# Patient Record
Sex: Male | Born: 1995 | Race: White | Hispanic: No | Marital: Single | Smoking: Never smoker
Health system: Southern US, Community
[De-identification: ages and names within clinical notes are randomized; demographics above are authoritative.]

## PROBLEM LIST (undated history)

## (undated) DIAGNOSIS — C801 Malignant (primary) neoplasm, unspecified: Secondary | ICD-10-CM

## (undated) DIAGNOSIS — F840 Autistic disorder: Secondary | ICD-10-CM

---
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
## 2018-01-03 ENCOUNTER — Other Ambulatory Visit: Payer: Self-pay | Admitting: Urology

## 2018-01-03 ENCOUNTER — Other Ambulatory Visit (HOSPITAL_COMMUNITY)
Admission: RE | Admit: 2018-01-03 | Discharge: 2018-01-03 | Disposition: A | Discharge status: Home / Self Care | Payer: BLUE CROSS/BLUE SHIELD | Source: Other Acute Inpatient Hospital | Attending: Urology | Admitting: Urology

## 2018-01-03 ENCOUNTER — Other Ambulatory Visit: Payer: Self-pay

## 2018-01-03 ENCOUNTER — Encounter (HOSPITAL_BASED_OUTPATIENT_CLINIC_OR_DEPARTMENT_OTHER): Payer: Self-pay

## 2018-01-03 DIAGNOSIS — C6290 Malignant neoplasm of unspecified testis, unspecified whether descended or undescended: Secondary | ICD-10-CM | POA: Diagnosis present

## 2018-01-03 LAB — PROTIME-INR
INR: 1.06
Prothrombin Time: 13.7 seconds (ref 11.4–15.2)

## 2018-01-03 LAB — APTT: aPTT: 32 seconds (ref 24–36)

## 2018-01-03 NOTE — Progress Notes (Signed)
Spoke with: Greg Peterson and his father Greg Peterson NPO:  After Midnight, no gum, candy, or mints   Arrival time:  9:00AM Labs:  01/03/2018 CBC, CMP, PT, PTT, Tumor Marker at Dr. Ralene Muskrat office AM medications:  None Pre op orders: Needs second sign Ride home: Greg Peterson (dad) [phone]

## 2018-01-03 NOTE — H&P (Signed)
CC: I have testicular cancer.  HPI: Greg Peterson is a 22 year-old male patient who was referred by Dr. Consuello Masse, MD who is here for testicular cancer.  The problem is on the right side. His cancer was diagnosed 12/31/2017. He has not had any treatment for his testicular cancer.   Greg is a 22 yo WM who is sent in consultation by Dr. Quintin Alto for a right testicular mass. Greg had the onset on 1/26 of right testicular pain. A scrotal US on 1/28 showed a 5.9cm right testicle with an infiltrating mass consistent with a right testicular neoplasm. He has had no testicular trauma. He had a cut on the mons recently. He has had no GU surgery or UTI's. He has had some abdominal pain and headaches. He has had some back pain as well over the holiday. He hasn't had any weight loss.      ALLERGIES: None   MEDICATIONS: None   GU PSH: None   NON-GU PSH: None   GU PMH: None   NON-GU PMH: None   FAMILY HISTORY: Congestive Heart Failure - Grandmother Prostate Cancer - Grandfather von Willebrand disease - Cousin   SOCIAL HISTORY: Marital Status: Single Preferred Language: English; Race: White Current Smoking Status: Patient has never smoked.   Tobacco Use Assessment Completed: Used Tobacco in last 30 days? Drinks 2 caffeinated drinks per day. Patient's occupation Education officer, environmental.    REVIEW OF SYSTEMS:    GU Review Male:   Patient denies frequent urination, hard to postpone urination, burning/ pain with urination, get up at night to urinate, leakage of urine, stream starts and stops, trouble starting your stream, have to strain to urinate , erection problems, and penile pain.  Gastrointestinal (Upper):   Patient denies nausea, vomiting, and indigestion/ heartburn.  Gastrointestinal (Lower):   Patient denies diarrhea and constipation.  Constitutional:   Patient denies fever, night sweats, weight loss, and fatigue.  Skin:   Patient reports skin rash/ lesion and itching.   Eyes:   Patient  denies blurred vision and double vision.  Ears/ Nose/ Throat:   Patient denies sore throat and sinus problems.  Hematologic/Lymphatic:   Patient denies swollen glands and easy bruising.  Cardiovascular:   Patient denies leg swelling and chest pains.  Respiratory:   Patient denies cough and shortness of breath.  Endocrine:   Patient denies excessive thirst.  Musculoskeletal:   Patient reports back pain. Patient denies joint pain.  Neurological:   Patient reports headaches. Patient denies dizziness.  Psychologic:   Patient denies depression and anxiety.   VITAL SIGNS:      01/03/2018 02:10 PM  Weight 185 lb / 83.91 kg  Height 71 in / 180.34 cm  BP 125/8 mmHg  Pulse 75 /min  Temperature 97.6 F / 36.4 C  BMI 25.8 kg/m   GU PHYSICAL EXAMINATION:    Scrotum: No lesions. No edema. No cysts. No warts.  Epididymides: Right: not distinct from the testicular mass. Left: No spermatocele, no masses, no cysts, no tenderness, no induration, no enlargement.   Testes: Solid mass right testis which is about 6cm, hard and non-tender. Normal location left testis. Normal location right testis. No mass, no cyst, no varicocele, no hydrocele left testis. No cyst, no varicocele, no hydrocele right testis.   Urethral Meatus: Normal size. No lesion, no wart, no discharge, no polyp. Normal location.  Penis: Penis uncircumcised. No foreskin warts, no cracks. No dorsal peyronie's plaques, no left corporal peyronie's plaques, no right corporal  peyronie's plaques, no scarring, no shaft warts. No balanitis, no meatal stenosis.    MULTI-SYSTEM PHYSICAL EXAMINATION:    Constitutional: Well-nourished. No physical deformities. Normally developed. Good grooming.  Neck: Neck symmetrical, not swollen. Normal tracheal position.  Respiratory: No labored breathing, no use of accessory muscles. CTA  Cardiovascular: Normal temperature, RRR without murmur   Lymphatic: No enlargement of neck, axillae, groin.  Skin: No paleness,  no jaundice, no cyanosis. No lesion, no ulcer, no rash.  Neurologic / Psychiatric: Oriented to time, oriented to place, oriented to person. No depression, no anxiety, no agitation.  Gastrointestinal: No hernia. No mass, no tenderness, no rigidity, non obese abdomen.   Musculoskeletal: Normal gait and station of head and neck.     PAST DATA REVIEWED:  Source Of History:  Patient  Lab Test Review:   CBC with Diff, CMP  Records Review:   Previous Doctor Records  Urine Test Review:   Urinalysis  X-Ray Review: Scrotal Ultrasound: Reviewed Films. Reviewed Report. Discussed With Patient. 5.9cm right testicular mass.    Notes:                     CMP and CBC are normal. PT and PTT are 13.7 and 31.8 with an INR of 1.06 and PFA is pending along with tumor markers that were ordered today. 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 I am going to order tumor markers, CBC, CMP and PT, PTT and Platelet functional assay because of the family history of Von Willibrand's diseaes. I am going to get him set up for a right radical orchiectomy tomorrow and reviewed the risks of bleeding, infection, nerve injury, hernia, wound complications, thrombotic events and anesthetic complications. I discuss a testicular prosthesis and the associated risks of infection and dislodgement. He will think about that and let me know tomorrow. He will need CT Chest, Abd and Pelvis but that will be done after his surgery in the next few days.    PLAN:           Orders Labs CBC with Diff, CMP, Beta HCG, LDH, Alpha Fetoprotein (AFP), Prothrombin Time  (PT), Prothrombin Time (PT)          Schedule Return Visit/Planned Activity: ASAP - Schedule Surgery          Document Letter(s):  Created for Patient: Clinical Summary         Notes:   CC: Dr. Consuello Masse.

## 2018-01-04 ENCOUNTER — Encounter (HOSPITAL_BASED_OUTPATIENT_CLINIC_OR_DEPARTMENT_OTHER): Payer: Self-pay | Admitting: Anesthesiology

## 2018-01-04 ENCOUNTER — Ambulatory Visit (HOSPITAL_BASED_OUTPATIENT_CLINIC_OR_DEPARTMENT_OTHER)
Admission: RE | Admit: 2018-01-04 | Discharge: 2018-01-04 | Disposition: A | Discharge status: Home / Self Care | Payer: BLUE CROSS/BLUE SHIELD | Source: Ambulatory Visit | Attending: Urology | Admitting: Urology

## 2018-01-04 ENCOUNTER — Ambulatory Visit (HOSPITAL_BASED_OUTPATIENT_CLINIC_OR_DEPARTMENT_OTHER): Payer: BLUE CROSS/BLUE SHIELD | Admitting: Anesthesiology

## 2018-01-04 ENCOUNTER — Other Ambulatory Visit: Payer: Self-pay

## 2018-01-04 ENCOUNTER — Encounter (HOSPITAL_BASED_OUTPATIENT_CLINIC_OR_DEPARTMENT_OTHER)
Admission: RE | Disposition: A | Discharge status: Home / Self Care | Payer: Self-pay | Source: Ambulatory Visit | Attending: Urology

## 2018-01-04 DIAGNOSIS — C6291 Malignant neoplasm of right testis, unspecified whether descended or undescended: Secondary | ICD-10-CM | POA: Insufficient documentation

## 2018-01-04 DIAGNOSIS — F840 Autistic disorder: Secondary | ICD-10-CM | POA: Diagnosis not present

## 2018-01-04 DIAGNOSIS — N509 Disorder of male genital organs, unspecified: Secondary | ICD-10-CM | POA: Diagnosis present

## 2018-01-04 HISTORY — PX: ORCHIECTOMY: SHX2116

## 2018-01-04 HISTORY — DX: Autistic disorder: F84.0

## 2018-01-04 SURGERY — ORCHIECTOMY
Anesthesia: General | Laterality: Right

## 2018-01-04 MED ORDER — OXYCODONE HCL 5 MG/5ML PO SOLN
5.0000 mg | Freq: Once | ORAL | Status: AC | PRN
  Filled 2018-01-04: qty 5

## 2018-01-04 MED ORDER — WHITE PETROLATUM EX OINT
TOPICAL_OINTMENT | CUTANEOUS | Status: AC
  Filled 2018-01-04: qty 5

## 2018-01-04 MED ORDER — FENTANYL CITRATE (PF) 100 MCG/2ML IJ SOLN
INTRAMUSCULAR | Status: AC
  Filled 2018-01-04: qty 2

## 2018-01-04 MED ORDER — CEFAZOLIN SODIUM-DEXTROSE 2-4 GM/100ML-% IV SOLN
INTRAVENOUS | Status: AC
  Filled 2018-01-04: qty 100

## 2018-01-04 MED ORDER — OXYCODONE HCL 5 MG PO TABS
5.0000 mg | ORAL_TABLET | ORAL | Status: DC | PRN
  Filled 2018-01-04: qty 2

## 2018-01-04 MED ORDER — SODIUM CHLORIDE 0.9% FLUSH
3.0000 mL | INTRAVENOUS | Status: DC | PRN
  Filled 2018-01-04: qty 3

## 2018-01-04 MED ORDER — LIDOCAINE 2% (20 MG/ML) 5 ML SYRINGE
INTRAMUSCULAR | Status: DC | PRN
  Administered 2018-01-04: 100 mg via INTRAVENOUS

## 2018-01-04 MED ORDER — MIDAZOLAM HCL 2 MG/2ML IJ SOLN
INTRAMUSCULAR | Status: DC | PRN
  Administered 2018-01-04: 2 mg via INTRAVENOUS

## 2018-01-04 MED ORDER — SODIUM CHLORIDE 0.9 % IV SOLN
250.0000 mL | INTRAVENOUS | Status: DC | PRN
  Filled 2018-01-04: qty 250

## 2018-01-04 MED ORDER — ONDANSETRON HCL 4 MG/2ML IJ SOLN
INTRAMUSCULAR | Status: DC | PRN
  Administered 2018-01-04: 4 mg via INTRAVENOUS

## 2018-01-04 MED ORDER — LACTATED RINGERS IV SOLN
INTRAVENOUS | Status: DC
  Administered 2018-01-04 (×2): via INTRAVENOUS
  Filled 2018-01-04: qty 1000

## 2018-01-04 MED ORDER — PROPOFOL 10 MG/ML IV BOLUS
INTRAVENOUS | Status: DC | PRN
  Administered 2018-01-04: 200 mg via INTRAVENOUS

## 2018-01-04 MED ORDER — ACETAMINOPHEN 325 MG PO TABS
650.0000 mg | ORAL_TABLET | ORAL | Status: DC | PRN
  Filled 2018-01-04: qty 2

## 2018-01-04 MED ORDER — OXYCODONE HCL 5 MG PO TABS
5.0000 mg | ORAL_TABLET | Freq: Once | ORAL | Status: AC | PRN
  Administered 2018-01-04: 5 mg via ORAL
  Filled 2018-01-04: qty 1

## 2018-01-04 MED ORDER — CEFAZOLIN SODIUM-DEXTROSE 2-4 GM/100ML-% IV SOLN
2.0000 g | INTRAVENOUS | Status: AC
  Administered 2018-01-04: 2 g via INTRAVENOUS
  Filled 2018-01-04: qty 100

## 2018-01-04 MED ORDER — FENTANYL CITRATE (PF) 100 MCG/2ML IJ SOLN
25.0000 ug | INTRAMUSCULAR | Status: DC | PRN
  Administered 2018-01-04: 25 ug via INTRAVENOUS
  Filled 2018-01-04: qty 1

## 2018-01-04 MED ORDER — FENTANYL CITRATE (PF) 100 MCG/2ML IJ SOLN
INTRAMUSCULAR | Status: DC | PRN
  Administered 2018-01-04 (×2): 50 ug via INTRAVENOUS

## 2018-01-04 MED ORDER — HYDROCODONE-ACETAMINOPHEN 5-325 MG PO TABS: 1.0000 | ORAL_TABLET | Freq: Four times a day (QID) | ORAL | 0 refills | Status: DC | PRN

## 2018-01-04 MED ORDER — MORPHINE SULFATE (PF) 2 MG/ML IV SOLN
2.0000 mg | INTRAVENOUS | Status: DC | PRN
  Filled 2018-01-04: qty 1

## 2018-01-04 MED ORDER — ACETAMINOPHEN 650 MG RE SUPP
650.0000 mg | RECTAL | Status: DC | PRN
  Filled 2018-01-04: qty 1

## 2018-01-04 MED ORDER — SODIUM CHLORIDE 0.9% FLUSH
3.0000 mL | Freq: Two times a day (BID) | INTRAVENOUS | Status: DC
  Filled 2018-01-04: qty 3

## 2018-01-04 MED ORDER — PROMETHAZINE HCL 25 MG/ML IJ SOLN
6.2500 mg | INTRAMUSCULAR | Status: DC | PRN
  Filled 2018-01-04: qty 1

## 2018-01-04 MED ORDER — BUPIVACAINE HCL (PF) 0.25 % IJ SOLN
INTRAMUSCULAR | Status: DC | PRN
  Administered 2018-01-04: 8 mL

## 2018-01-04 MED ORDER — MIDAZOLAM HCL 2 MG/2ML IJ SOLN
INTRAMUSCULAR | Status: AC
  Filled 2018-01-04: qty 2

## 2018-01-04 MED ORDER — OXYCODONE HCL 5 MG PO TABS
ORAL_TABLET | ORAL | Status: AC
  Filled 2018-01-04: qty 1

## 2018-01-04 MED ORDER — DEXAMETHASONE SODIUM PHOSPHATE 10 MG/ML IJ SOLN
INTRAMUSCULAR | Status: DC | PRN
  Administered 2018-01-04: 10 mg via INTRAVENOUS

## 2018-01-04 SURGICAL SUPPLY — 53 items
ADH SKN CLS APL DERMABOND .7 (GAUZE/BANDAGES/DRESSINGS) ×1
APPLICATOR COTTON TIP 6IN STRL (MISCELLANEOUS) IMPLANT
BLADE CLIPPER SURG (BLADE) ×5 IMPLANT
BLADE SURG 15 STRL LF DISP TIS (BLADE) ×1 IMPLANT
BLADE SURG 15 STRL SS (BLADE) ×3
BNDG GAUZE ELAST 4 BULKY (GAUZE/BANDAGES/DRESSINGS) ×3 IMPLANT
CANISTER SUCTION 1200CC (MISCELLANEOUS) ×3 IMPLANT
CLEANER CAUTERY TIP 5X5 PAD (MISCELLANEOUS) ×1 IMPLANT
COVER BACK TABLE 60X90IN (DRAPES) ×3 IMPLANT
COVER MAYO STAND STRL (DRAPES) ×3 IMPLANT
COVER SURGICAL LIGHT HANDLE (MISCELLANEOUS) ×2 IMPLANT
DERMABOND ADVANCED (GAUZE/BANDAGES/DRESSINGS) ×2
DERMABOND ADVANCED .7 DNX12 (GAUZE/BANDAGES/DRESSINGS) ×1 IMPLANT
DISSECTOR ROUND CHERRY 3/8 STR (MISCELLANEOUS) IMPLANT
DRAIN PENROSE 18X1/4 LTX STRL (WOUND CARE) ×2 IMPLANT
DRAPE LAPAROTOMY TRNSV 102X78 (DRAPE) ×3 IMPLANT
DRSG TEGADERM 4X4.75 (GAUZE/BANDAGES/DRESSINGS) ×4 IMPLANT
ELECT NDL TIP 2.8 STRL (NEEDLE) IMPLANT
ELECT NEEDLE TIP 2.8 STRL (NEEDLE) ×3 IMPLANT
ELECT REM PT RETURN 9FT ADLT (ELECTROSURGICAL) ×3
ELECTRODE REM PT RTRN 9FT ADLT (ELECTROSURGICAL) ×1 IMPLANT
GAUZE SPONGE 4X4 12PLY STRL (GAUZE/BANDAGES/DRESSINGS) ×2 IMPLANT
GLOVE SURG SS PI 8.0 STRL IVOR (GLOVE) ×3 IMPLANT
GOWN STRL REUS W/TWL LRG LVL3 (GOWN DISPOSABLE) IMPLANT
GOWN STRL REUS W/TWL XL LVL3 (GOWN DISPOSABLE) ×3 IMPLANT
KIT RM TURNOVER CYSTO AR (KITS) ×3 IMPLANT
NEEDLE HYPO 22GX1.5 SAFETY (NEEDLE) ×3 IMPLANT
NS IRRIG 500ML POUR BTL (IV SOLUTION) ×2 IMPLANT
PACK BASIN DAY SURGERY FS (CUSTOM PROCEDURE TRAY) ×3 IMPLANT
PAD CLEANER CAUTERY TIP 5X5 (MISCELLANEOUS) ×2
PENCIL BUTTON HOLSTER BLD 10FT (ELECTRODE) ×3 IMPLANT
SET COLLECT BLD 21X3/4 12 (NEEDLE) IMPLANT
SUPPORT SCROTAL LG STRP (MISCELLANEOUS) IMPLANT
SUPPORTER ATHLETIC LG (MISCELLANEOUS)
SUT CHROMIC 3 0 SH 27 (SUTURE) ×1 IMPLANT
SUT CHROMIC GUT AB #0 18 (SUTURE) IMPLANT
SUT MNCRL AB 4-0 PS2 18 (SUTURE) ×2 IMPLANT
SUT SILK 3 0 TIES 17X18 (SUTURE)
SUT SILK 3-0 18XBRD TIE BLK (SUTURE) IMPLANT
SUT VIC AB 3-0 SH 27 (SUTURE) ×3
SUT VIC AB 3-0 SH 27X BRD (SUTURE) IMPLANT
SUT VICRYL 0 TIES 12 18 (SUTURE) ×2 IMPLANT
SUT VICRYL 2 0 18  UND BR (SUTURE)
SUT VICRYL 2 0 18 UND BR (SUTURE) IMPLANT
SYR 20CC LL (SYRINGE) ×3 IMPLANT
SYR BULB IRRIGATION 50ML (SYRINGE) ×3 IMPLANT
SYR CONTROL 10ML LL (SYRINGE) ×3 IMPLANT
TOWEL OR 17X24 6PK STRL BLUE (TOWEL DISPOSABLE) ×6 IMPLANT
TRAY DSU PREP LF (CUSTOM PROCEDURE TRAY) ×3 IMPLANT
TUBE CONNECTING 12'X1/4 (SUCTIONS) ×1
TUBE CONNECTING 12X1/4 (SUCTIONS) ×2 IMPLANT
WATER STERILE IRR 500ML POUR (IV SOLUTION) IMPLANT
YANKAUER SUCT BULB TIP NO VENT (SUCTIONS) ×3 IMPLANT

## 2018-01-04 NOTE — Discharge Instructions (Signed)
[place] Hygiene: You may apply an ice bag to the incision area for the first 4-6 hours.  This may help decrease swelling and soreness.  You may have a dressing held in place by an athletic supporter.  You may remove the dressing in 24 hours and shower in 48 hours.  Continue to use the athletic supporter or tight briefs for at least a week. Activity: Rest today - not necessarily flat bed rest.  Just take it easy.  You should not do strenuous activities until your follow-up visit with your doctor.  You may resume light activity in 48 hours.  Return to Work:  Your doctor will advise you of this depending on the type of work you do  Diet: Drink liquids or eat a light diet this evening.  You may resume a regular diet tomorrow.  General Expectations: You may have a small amount of bleeding.  The scrotum may be swollen or bruised for about a week.  Call your Doctor if these occur:  -persistent or heavy bleeding  -temperature of 101 degrees or more  -severe pain, not relieved by your pain medication       Post Anesthesia Home Care Instructions  Activity: Get plenty of rest for the remainder of the day. A responsible individual must stay with you for 24 hours following the procedure.  For the next 24 hours, DO NOT: -Drive a car -Paediatric nurse -Drink alcoholic beverages -Take any medication unless instructed by your physician -Make any legal decisions or sign important papers.  Meals: Start with liquid foods such as gelatin or soup. Progress to regular foods as tolerated. Avoid greasy, spicy, heavy foods. If nausea and/or vomiting occur, drink only clear liquids until the nausea and/or vomiting subsides. Call your physician if vomiting continues.  Special Instructions/Symptoms: Your throat may feel dry or sore from the anesthesia or the breathing tube placed in your throat during surgery. If this causes discomfort, gargle with  warm salt water. The discomfort should disappear within 24 hours.  If you had a scopolamine patch placed behind your ear for the management of post- operative nausea and/or vomiting:  1. The medication in the patch is effective for 72 hours, after which it should be removed.  Wrap patch in a tissue and discard in the trash. Wash hands thoroughly with soap and water. 2. You may remove the patch earlier than 72 hours if you experience unpleasant side effects which may include dry mouth, dizziness or visual disturbances. 3. Avoid touching the patch. Wash your hands with soap and water after contact with the patch.

## 2018-01-04 NOTE — Transfer of Care (Signed)
Immediate Anesthesia Transfer of Care Note  Patient: Greg Peterson  Procedure(s) Performed: RIGHT RADICAL ORCHIECTOMY (Right )  Patient Location: PACU  Anesthesia Type:General  Level of Consciousness: awake, alert , oriented and patient cooperative  Airway & Oxygen Therapy: Patient Spontanous Breathing and Patient connected to nasal cannula oxygen  Post-op Assessment: Report given to RN and Post -op Vital signs reviewed and stable  Post vital signs: Reviewed and stable  Last Vitals:  Vitals:   01/04/18 0901  BP: 131/79  Pulse: 74  Resp: 16  Temp: (!) 36.4 C  SpO2: 99%    Last Pain:  Vitals:   01/04/18 0951  TempSrc:   PainSc: 5       Patients Stated Pain Goal: 5 (09/81/19 1478)  Complications: No apparent anesthesia complications

## 2018-01-04 NOTE — Anesthesia Postprocedure Evaluation (Signed)
Anesthesia Post Note  Patient: [name]  Procedure(s) Performed: RIGHT RADICAL ORCHIECTOMY (Right )     Patient location during evaluation: PACU Anesthesia Type: General Level of consciousness: awake and alert Pain management: pain level controlled Vital Signs Assessment: post-procedure vital signs reviewed and stable Respiratory status: spontaneous breathing, nonlabored ventilation and respiratory function stable Cardiovascular status: blood pressure returned to baseline and stable Postop Assessment: no apparent nausea or vomiting Anesthetic complications: no    Last Vitals:  Vitals:   01/04/18 1300 01/04/18 1315  BP: (!) 134/91 (!) 126/92  Pulse: 83 84  Resp: 19 (!) 26  Temp:    SpO2: 100% 99%    Last Pain:  Vitals:   01/04/18 1300  TempSrc:   PainSc: [place]

## 2018-01-04 NOTE — Anesthesia Preprocedure Evaluation (Addendum)
Anesthesia Evaluation  Patient identified by MRN, date of birth, ID band Patient awake    Reviewed: Allergy & Precautions, NPO status , Patient's Chart, lab work & pertinent test results  Airway Mallampati: II  TM Distance: >3 FB Neck ROM: Full    Dental  (+) Dental Advisory Given   Pulmonary neg pulmonary ROS,    Pulmonary exam normal breath sounds clear to auscultation       Cardiovascular negative cardio ROS Normal cardiovascular exam Rhythm:Regular Rate:Normal     Neuro/Psych Autism spectrum d/o Pleasant/Cooperativenegative neurological ROS     GI/Hepatic negative GI ROS, Neg liver ROS,   Endo/Other  negative endocrine ROS  Renal/GU negative Renal ROS  negative genitourinary   Musculoskeletal negative musculoskeletal ROS (+)   Abdominal   Peds  Hematology negative hematology ROS (+)   Anesthesia Other Findings   Reproductive/Obstetrics                            Anesthesia Physical Anesthesia Plan  ASA: I  Anesthesia Plan: General   Post-op Pain Management:    Induction: Intravenous  PONV Risk Score and Plan: 3 and Treatment may vary due to age or medical condition, Ondansetron, Dexamethasone and Midazolam  Airway Management Planned: LMA  Additional Equipment: None  Intra-op Plan:   Post-operative Plan: Extubation in OR  Informed Consent: I have reviewed the patients History and Physical, chart, labs and discussed the procedure including the risks, benefits and alternatives for the proposed anesthesia with the patient or authorized representative who has indicated his/her understanding and acceptance.   Dental advisory given  Plan Discussed with: CRNA  Anesthesia Plan Comments:         Anesthesia Quick Evaluation

## 2018-01-04 NOTE — Interval H&P Note (Signed)
History and Physical Interval Note:  He is not sure about the prosthesis so I will not place one at this time.   01/04/2018 10:42 AM  Greg Peterson  has presented today for surgery, with the diagnosis of RIGHT TESTICULAR MASS  The various methods of treatment have been discussed with the patient and family. After consideration of risks, benefits and other options for treatment, the patient has consented to  Procedure(s): RIGHT RADICAL ORCHIECTOMY POSSIBLE RIGHT TESTICULAR PROSTHESIS (Right) as a surgical intervention .  The patient's history has been reviewed, patient examined, no change in status, stable for surgery.  I have reviewed the patient's chart and labs.  Questions were answered to the patient's satisfaction.     Irine Seal

## 2018-01-04 NOTE — Anesthesia Procedure Notes (Signed)
Procedure Name: LMA Insertion Date/Time: 01/04/2018 11:07 AM Performed by: Wanita Chamberlain, CRNA Pre-anesthesia Checklist: Patient identified, Timeout performed, Suction available, Patient being monitored and Emergency Drugs available Patient Re-evaluated:Patient Re-evaluated prior to induction Oxygen Delivery Method: Circle system utilized Preoxygenation: Pre-oxygenation with 100% oxygen Induction Type: IV induction Ventilation: Mask ventilation without difficulty LMA: LMA inserted LMA Size: 4.0 Number of attempts: 1 Placement Confirmation: positive ETCO2,  CO2 detector and breath sounds checked- equal and bilateral Tube secured with: Tape Dental Injury: Teeth and Oropharynx as per pre-operative assessment

## 2018-01-04 NOTE — Op Note (Signed)
Procedure: Right radical orchiectomy.  Preop diagnosis: Right testicular mass consistent with testicular cancer.  Postop diagnosis: Same.  Surgeon: Dr. Irine Seal.  Anesthesia: General.  Specimen: Right testicle and cord.  Drain: None.  EBL: Minimal.  Complications: None.  Indications: Greg Peterson is a 22 year old white male who presented to the office yesterday with a right testicular mass that was consistent with neoplasm on ultrasound.  He is to undergo right radical orchiectomy but has declined testicular prosthesis.  Procedure: He was taken to the operating room where general anesthetic was induced and he was given Ancef.  He was placed in a supine position.  His lower abdomen and scrotum were clipped.  He was prepped with Betadine solution and draped in usual sterile fashion.  He had been fitted with PAS hose prior to prepping and draping.  The testicle was palpated to confirm the mass was on the right side which it was.  An incision was made in the low right inguinal region along the skin lines approximately 5 cm in length with a scalpel.  The surrounding subcutaneous tissue was then infiltrated with 8 cc of quarter percent Marcaine.  The subcutaneous tissue was incised with the Bovie and the anterior oblique fascia was exposed.  The fascia was opened along the fibers over the cord.  The ilioinguinal nerve was identified and was protected.  The cord was dissected out bluntly and 1/4 inch Penrose drain was used to occlude the cord during the dissection.  The testicle was then delivered into the inguinal incision and the gubernacular attachments were taken down with the Bovie.  The cord was dissected back to the inguinal ring and divided into 2 packets.  Each packet was clamped with a hemostat and the specimen was removed.  Each of the packets was then doubly ligated with a 0 Vicryl tie and the long tag was left.  The incision was inspected for bleeding and none was found.  The wound was  irrigated.  The external oblique fascia was closed using running 3-0 Vicryl suture.  The subcutaneous tissue was reapproximated using interrupted 3-0 chromic sutures.  The skin was closed using an intracuticular 4-0 Monocryl suture.  The wound was reinforced with Dermabond.  The drapes were removed and a dressing was applied.  A fluffed Kerlix and scrotal support were then positioned.  His anesthetic was reversed and he was moved to recovery room in stable condition.  There were no complications.

## 2018-01-07 ENCOUNTER — Encounter (HOSPITAL_BASED_OUTPATIENT_CLINIC_OR_DEPARTMENT_OTHER): Payer: Self-pay | Admitting: Urology

## 2018-01-23 ENCOUNTER — Encounter: Payer: Self-pay | Admitting: Oncology

## 2018-01-30 ENCOUNTER — Telehealth: Payer: Self-pay | Admitting: Oncology

## 2018-01-30 ENCOUNTER — Encounter: Payer: Self-pay | Admitting: *Deleted

## 2018-01-30 ENCOUNTER — Other Ambulatory Visit: Payer: Self-pay | Admitting: *Deleted

## 2018-01-30 ENCOUNTER — Inpatient Hospital Stay: Payer: BLUE CROSS/BLUE SHIELD | Attending: Oncology | Admitting: Oncology

## 2018-01-30 VITALS — BP 135/96 | HR 84 | Temp 97.7°F | Resp 16 | Wt 183.4 lb

## 2018-01-30 DIAGNOSIS — C6290 Malignant neoplasm of unspecified testis, unspecified whether descended or undescended: Secondary | ICD-10-CM

## 2018-01-30 DIAGNOSIS — C6291 Malignant neoplasm of right testis, unspecified whether descended or undescended: Secondary | ICD-10-CM | POA: Diagnosis not present

## 2018-01-30 DIAGNOSIS — F840 Autistic disorder: Secondary | ICD-10-CM

## 2018-01-30 DIAGNOSIS — R1111 Vomiting without nausea: Secondary | ICD-10-CM | POA: Diagnosis not present

## 2018-01-30 NOTE — Progress Notes (Signed)
Reason for Referral: Testis cancer.  HPI: 22 year old gentleman currently of [place] where he lives with his family.  He is a gentleman with the autism but no other comorbid conditions.  He was noted to have right testicular pain and enlargement in January 2019.  He was evaluated by his primary care physician and a scrotal ultrasound obtained on January 28 showed 5.9 cm right testicular mass suspicious for a neoplasm.  He subsequently evaluated by Dr. Jeffie Pollock and underwent a right radical orchiectomy on January 04, 2018.  He tolerated the procedure well without complications.  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  Margins are not involved with focal lymphovascular invasion of the tumor identified.  The final pathological staging showed T2.  Imaging studies of the abdomen and pelvis showed no lymphadenopathy and his chest x-ray was also within normal range.  His tumor markers are also not elevated prior to surgery.  He recovered well from the operation without any delayed complications.  And resumed most activities of daily living.  He was referred to me for evaluation regarding further treatment.  He has been feeling reasonably well up until today and had an episode of vomiting as he arrived to the cancer center and have had 2 episodes since that time.  He denies any nausea or sick contacts.  He denies any diarrhea or abdominal discomfort.  He remains active doing volunteer work.  He does not report any headaches, blurry vision, syncope or seizures. Does not report any fevers, chills or sweats.  Does not report any cough, wheezing or hemoptysis.  Does not report any chest pain, palpitation, orthopnea or leg edema.  Does not report any nausea, or abdominal pain.  He does not report any constipation or diarrhea.  Does not report any skeletal complaints.    Does not report frequency, urgency or hematuria.  Does not report any skin rashes or lesions. Does not  report any heat or cold intolerance.  Does not report any lymphadenopathy or petechiae.  Does not report any anxiety or depression.  Remaining review of systems is negative.      Past Medical History:  Diagnosis Date  . Autism spectrum disorder   :  Past Surgical History:  Procedure Laterality Date  . ORCHIECTOMY Right 01/04/2018   Procedure: RIGHT RADICAL ORCHIECTOMY;  Surgeon: Irine Seal, MD;  Location[facility];  Service: Urology;  Laterality: Right;  :   Current Outpatient Medications:  .  HYDROcodone-acetaminophen (NORCO) 5-325 MG tablet, Take 1 tablet by mouth every 6 (six) hours as needed for moderate pain. (Patient not taking: Reported on 01/30/2018), Disp: 6 tablet, Rfl: 0 .  ibuprofen (ADVIL,MOTRIN) 200 MG tablet, Take 200 mg by mouth every 6 (six) hours as needed., Disp: , Rfl: :  No Known Allergies:  No family history on file.:  Social History   Socioeconomic History  . Marital status: Single    Spouse name: Not on file  . Number of children: Not on file  . Years of education: Not on file  . Highest education level: Not on file  Social Needs  . Financial resource strain: Not on file  . Food insecurity - worry: Not on file  . Food insecurity - inability: Not on file  . Transportation needs - medical: Not on file  . Transportation needs - non-medical: Not on file  Occupational History  . Not on file  Tobacco Use  . Smoking status: Never Smoker  .  Smokeless tobacco: Never Used  Substance and Sexual Activity  . Alcohol use: No    Frequency: Never  . Drug use: No  . Sexual activity: Not on file  Other Topics Concern  . Not on file  Social History Narrative  . Not on file  :  Pertinent items are noted in HPI.  Exam: Blood pressure (!) 135/96, pulse 84, temperature 97.7 F (36.5 C), temperature source Axillary, resp. rate 16, weight 183 lb 7 oz (83.2 kg), SpO2 98 %. General appearance: alert and cooperative.  Appeared  comfortable. Head: atraumatic without any abnormalities. Eyes: conjunctivae/corneas clear. PERRL.  Sclera anicteric. Throat: lips, mucosa, and tongue normal; without oral thrush or ulcers. Resp: clear to auscultation bilaterally without rhonchi, wheezes or dullness to percussion. Cardio: regular rate and rhythm, S1, S2 normal, no murmur, click, rub or gallop GI: soft, non-tender; bowel sounds normal; no masses,  no organomegaly Skin: Skin color, texture, turgor normal. No rashes or lesions Lymph nodes: Cervical, supraclavicular, and axillary nodes normal. GU exam: Well-healed incision noted in the right inguinal area.  No lymphadenopathy or abscess. Neurologic: Grossly normal without any motor, sensory or deep tendon reflexes. Musculoskeletal: No joint deformity or effusion.    Assessment and Plan:   22 year old woman with the following issues:  1.  Pure seminoma of the right testicle.  He presented with a painless right testicular mass nose measuring about 5.7 cm.  He is status post orchiectomy on January 04, 2018 with a final pathology revealed pure seminoma T2 N0 clinical stage.  Focal lymphovascular invasion was noted with negative margins.  Tumor markers including beta hCG and alpha-fetoprotein are all within normal range.  The natural course of this disease was discussed today with the patient and his father that accompanied him today.  He is likely cured by orchiectomy alone close to 85% of the time.  His risk of recurrence includes the focal lymphovascular invasion but no other risk features identified.  Further management options were reviewed today in detail.  These options would include active surveillance, adjuvant carboplatin chemotherapy with 1-2 cycles or adjuvant radiation therapy.  The rationale and risks and benefits of all these approaches were discussed in detail.  Adjuvant radiation therapy has been deferred at this time given the likelihood of over treating the majority  of these patients and the risk of secondary malignancy that has been noted.  This would be an option if he is not reliable for any further active surveillance.  Adjuvant carboplatin chemotherapy was also reviewed today.  Complication associated with this therapy was reviewed which includes nausea, fatigue, thrombocytopenia, neutropenia among others.  I think this will be a reasonable option attempt to reduce chemotherapy exposure if the cancer does recur.  He is not interested in this treatment at this time.  The role of active surveillance was discussed today and I think is the best option for him.  I emphasized the importance of adhering to the active surveillance protocol at this time.  I have recommended active surveillance with CT scan of the abdomen and pelvis to be obtained at 3 months, 6 months and 12 months from his diagnosis.  He will have that also every 6 months and year 2 and 3 and every 12 months and year 4 and 5.  We will also have laboratory testing and physical exam on the same schedule.  Chest imaging will be obtained as needed.  After discussion today, he is opted with active surveillance which is the recommended approach  at this time.  2.  Vomiting: Unclear etiology at this time.  He does not appear to be related to his recent surgery or his cancer.  This could be related to anxiety or gastroenteritis.  I recommended supportive management and hydration and rest.  He is to go to the emergency department if he develop any fevers, shortness of breath or any other worsening symptoms.  3.  Follow-up: We will be in 3 months to repeat CT scan and tumor markers at the time.  60  minutes was spent with the patient face-to-face today.  More than 50% of time was dedicated to patient counseling, education and coordination of his multifaceted care.

## 2018-01-30 NOTE — Telephone Encounter (Signed)
Appointments scheduled. AVS/Calendar printed. Contrast material was given along with instructions.  In basket message was sent to Randolm Idol to have order changed to [facility] per patient request due to insurance. Per 2/27 los

## 2018-02-18 ENCOUNTER — Telehealth: Payer: Self-pay | Admitting: Oncology

## 2018-02-18 NOTE — Telephone Encounter (Signed)
Faxed records to [facility] on 02/18/18, release ID # [id number]

## 2018-04-17 ENCOUNTER — Ambulatory Visit
Admission: RE | Admit: 2018-04-17 | Discharge: 2018-04-17 | Disposition: A | Discharge status: Home / Self Care | Payer: BLUE CROSS/BLUE SHIELD | Source: Ambulatory Visit | Attending: Oncology | Admitting: Oncology

## 2018-04-17 DIAGNOSIS — C6290 Malignant neoplasm of unspecified testis, unspecified whether descended or undescended: Secondary | ICD-10-CM

## 2018-04-17 MED ORDER — IOHEXOL 300 MG/ML  SOLN
100.0000 mL | Freq: Once | INTRAMUSCULAR | Status: AC | PRN
  Administered 2018-04-17: 100 mL via INTRAVENOUS

## 2018-04-24 ENCOUNTER — Inpatient Hospital Stay: Payer: BLUE CROSS/BLUE SHIELD | Attending: Oncology

## 2018-04-24 ENCOUNTER — Other Ambulatory Visit: Payer: BLUE CROSS/BLUE SHIELD

## 2018-04-24 DIAGNOSIS — C6291 Malignant neoplasm of right testis, unspecified whether descended or undescended: Secondary | ICD-10-CM | POA: Diagnosis not present

## 2018-04-24 DIAGNOSIS — C6290 Malignant neoplasm of unspecified testis, unspecified whether descended or undescended: Secondary | ICD-10-CM

## 2018-04-24 LAB — LACTATE DEHYDROGENASE: LDH: 367 U/L — AB (ref 125–245)

## 2018-04-24 LAB — CMP (CANCER CENTER ONLY)
ALBUMIN: 4 g/dL (ref 3.5–5.0)
ALK PHOS: 104 U/L (ref 40–150)
ALT: 37 U/L (ref 0–55)
AST: 27 U/L (ref 5–34)
Anion gap: 9 (ref 3–11)
BILIRUBIN TOTAL: 0.9 mg/dL (ref 0.2–1.2)
BUN: 13 mg/dL (ref 7–26)
CO2: 26 mmol/L (ref 22–29)
Calcium: 9.6 mg/dL (ref 8.4–10.4)
Chloride: 108 mmol/L (ref 98–109)
Creatinine: 1.18 mg/dL (ref 0.70–1.30)
GFR, Est AFR Am: >60 mL/min (ref >60)
GFR, Estimated: >60 mL/min (ref >60)
GLUCOSE: 80 mg/dL (ref 70–140)
POTASSIUM: 3.9 mmol/L (ref 3.5–5.1)
Sodium: 143 mmol/L (ref 136–145)
TOTAL PROTEIN: 7.7 g/dL (ref 6.4–8.3)

## 2018-04-24 LAB — CBC WITH DIFFERENTIAL (CANCER CENTER ONLY)
BASOS ABS: 0.1 10*3/uL (ref 0.0–0.1)
Basophils Relative: 1 %
Eosinophils Absolute: 0.3 10*3/uL (ref 0.0–0.5)
Eosinophils Relative: 5 %
HEMATOCRIT: 46.2 % (ref 38.4–49.9)
Hemoglobin: 15.6 g/dL (ref 13.0–17.1)
LYMPHS ABS: 1.4 10*3/uL (ref 0.9–3.3)
LYMPHS PCT: 25 %
MCH: 29.7 pg (ref 27.2–33.4)
MCHC: 33.8 g/dL (ref 32.0–36.0)
MCV: 88 fL (ref 79.3–98.0)
MONO ABS: 0.7 10*3/uL (ref 0.1–0.9)
MONOS PCT: 13 %
NEUTROS ABS: 3.1 10*3/uL (ref 1.5–6.5)
Neutrophils Relative %: 56 %
PLATELETS: 339 10*3/uL (ref 140–400)
RBC: 5.25 MIL/uL (ref 4.20–5.82)
RDW: 12.8 % (ref 11.0–14.6)
WBC Count: 5.5 10*3/uL (ref 4.0–10.3)

## 2018-04-25 LAB — BETA HCG QUANT (REF LAB): hCG Quant: 121 m[IU]/mL — ABNORMAL HIGH (ref 0–3)

## 2018-04-25 LAB — AFP TUMOR MARKER: AFP, Serum, Tumor Marker: 1.2 ng/mL (ref 0.0–8.3)

## 2018-05-01 ENCOUNTER — Inpatient Hospital Stay (HOSPITAL_BASED_OUTPATIENT_CLINIC_OR_DEPARTMENT_OTHER): Payer: BLUE CROSS/BLUE SHIELD | Admitting: Oncology

## 2018-05-01 DIAGNOSIS — C6291 Malignant neoplasm of right testis, unspecified whether descended or undescended: Secondary | ICD-10-CM | POA: Diagnosis not present

## 2018-05-01 DIAGNOSIS — C6290 Malignant neoplasm of unspecified testis, unspecified whether descended or undescended: Secondary | ICD-10-CM

## 2018-05-01 MED ORDER — PROCHLORPERAZINE MALEATE 10 MG PO TABS: 10.0000 mg | ORAL_TABLET | Freq: Four times a day (QID) | ORAL | 0 refills | Status: DC | PRN

## 2018-05-01 NOTE — Progress Notes (Signed)
Hematology and Oncology Follow Up Visit  Greg Peterson [id number] Apr 07, 1996 22 y.o. 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  He had a T2N0 clinical stage at the time of diagnosis.  He developed stage IIc in May 2019 with a retroperitoneal adenopathy.   Prior Therapy:  He is status post orchiectomy completed on January 04, 2018.  The final pathology showed pure seminoma with lymphovascular invasion.  He developed recurrent disease with 3.2 cm periaortic lymphadenopathy and elevated hCG of 121 and LDH of 367  Current therapy: Under consideration to start systemic chemotherapy.  Interim History: Greg Peterson presents today for a follow-up visit with his father.  Since the last visit, he reports no major changes or complaints.  He still reports some soreness around his surgery site but no masses or lesions.  His appetite remain excellent and his weight is stable.  He does report some occasional abdominal discomfort but no nausea or vomiting.  His performance status and activity level he remains reasonable.  He reports appropriate mood and currently on Celexa for anxiety and depression.  He does not report any headaches, blurry vision, syncope or seizures. Does not report any fevers, chills or sweats.  Does not report any cough, wheezing or hemoptysis.  Does not report any chest pain, palpitation, orthopnea or leg edema.  Does not report any nausea, vomiting or abdominal pain.  Does not report any constipation or diarrhea.  Does not report any arthralgias or myalgias.   Does not report frequency, urgency or hematuria.  Does not report any skin rashes or lesions. Does not report any heat or cold intolerance.  Does not report any lymphadenopathy or petechiae.  Denies any worsening mood.  Remaining review of systems is negative.    Medications: I have reviewed the patient's current  medications.  Current Outpatient Medications  Medication Sig Dispense Refill  . HYDROcodone-acetaminophen (NORCO) 5-325 MG tablet Take 1 tablet by mouth every 6 (six) hours as needed for moderate pain. (Patient not taking: Reported on 01/30/2018) 6 tablet 0  . ibuprofen (ADVIL,MOTRIN) 200 MG tablet Take 200 mg by mouth every 6 (six) hours as needed.    . prochlorperazine (COMPAZINE) 10 MG tablet Take 1 tablet (10 mg total) by mouth every 6 (six) hours as needed for nausea or vomiting. 30 tablet 0   No current facility-administered medications for this visit.      Allergies: No Known Allergies  Past Medical History, Surgical history, Social history, and Family History were reviewed and updated.  Review of Systems:  Remaining ROS negative.  Physical Exam: Blood pressure 114/78, pulse 67, temperature 98 F (36.7 C), temperature source Oral, resp. rate 18, height 5\' 11"  (1.803 m), weight 185 lb 3.2 oz (84 kg), SpO2 100 %. ECOG: 0 General appearance: alert and cooperative appeared without distress. Head: Normocephalic, without obvious abnormality Oropharynx: No oral thrush or ulcers. Eyes: No scleral icterus.  Pupils are equal and round reactive to light. Lymph nodes: Cervical, supraclavicular, and axillary nodes normal. Heart:regular rate and rhythm, S1, S2 normal, no murmur, click, rub or gallop Lung:chest clear, no wheezing, rales, normal symmetric air entry Abdomin: soft, non-tender, without masses or organomegaly. Neurological: No motor, sensory deficits.  Intact deep tendon reflexes. Skin: No rashes or lesions.  No ecchymosis or petechiae. Musculoskeletal: No joint deformity or effusion. Psychiatric: Mood and affect are appropriate.    Lab Results: Lab  Results  Component Value Date   WBC 5.5 04/24/2018   HGB 15.6 04/24/2018   HCT 46.2 04/24/2018   MCV 88.0 04/24/2018   PLT 339 04/24/2018     Chemistry      Component Value Date/Time   NA 143 04/24/2018 1106   K 3.9  04/24/2018 1106   CL 108 04/24/2018 1106   CO2 26 04/24/2018 1106   BUN 13 04/24/2018 1106   CREATININE 1.18 04/24/2018 1106      Component Value Date/Time   CALCIUM 9.6 04/24/2018 1106   ALKPHOS 104 04/24/2018 1106   AST 27 04/24/2018 1106   ALT 37 04/24/2018 1106   BILITOT 0.9 04/24/2018 1106     EXAM: CT ABDOMEN AND PELVIS WITH CONTRAST  TECHNIQUE: Multidetector CT imaging of the abdomen and pelvis was performed using the standard protocol following bolus administration of intravenous contrast.  CONTRAST:  185mL OMNIPAQUE IOHEXOL 300 MG/ML  SOLN  COMPARISON:  01/09/2018 from [place]: Lower Chest: No acute findings.  Hepatobiliary: No hepatic masses identified. Gallbladder is unremarkable.  Pancreas:  No mass or inflammatory changes.  Spleen: Within normal limits in size and appearance.  Adrenals/Urinary Tract: No masses identified. No evidence of hydronephrosis.  Stomach/Bowel: No evidence of obstruction, inflammatory process or abnormal fluid collections. Normal appendix visualized.  Vascular/Lymphatic: New retroperitoneal lymphadenopathy is seen in the aortocaval space, with largest lymph node measuring 3.2 cm on image 43/2. No other sites of lymphadenopathy identified.  Reproductive:  No mass or other significant abnormality.  Other:  Expected postop changes from right orchiectomy.  Musculoskeletal:  No suspicious bone lesions identified.  IMPRESSION: New abdominal retroperitoneal lymphadenopathy in the aorto-caval space, consistent with metastatic disease.  No other sites of abdominal or pelvic metastatic disease identified.  These results will be called to the ordering clinician or representative by the Radiologist Assistant, and communication documented in the PACS or zVision Dashboard     Impression and Plan:   22 year old man with the following issues:  1.  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  He presented with a painless right testicular mass. He is status post orchiectomy on January 04, 2018 with a final pathology revealed pure seminoma T2 N0 clinical stage.  Focal lymphovascular invasion was noted with negative margins.    CT scan obtained on May 2019 for surveillance purposes was reviewed today and showed retroperitoneal adenopathy in the aortocaval space measuring 3.2 cm.  He has also elevated beta-hCG and LDH indicating tumor recurrence.  These findings were discussed today with the patient and his father as well as the natural course of this disease.  He has developed stage IIC seminoma and treatment options were reviewed.  Systemic chemotherapy remains the most definitive option at this time utilizing 3 cycles of BEP chemotherapy.  Risks and benefits of this chemotherapy was discussed today in details.  Complications include nausea, vomiting, myelosuppression, fatigue, neuropathy, infusion related complications and rarely severe infections, hospitalization and death.  Rare complications such as deep vein thrombosis, pulmonary embolism and severe pulmonary toxicity associated with bleomycin were also reviewed.  Alternative therapy would include radiation therapy which is inferior option given the fact that he has a lymph node measuring more than 2 cm.  After discussion today he is agreeable to proceed after chemotherapy education class.  The goal of therapy is curative with his chance of cure exceeding 90% with systemic chemotherapy.  He will receive 3 cycles of BEP every 3 weeks.  2.  IV access: Risks and benefits of a Port-A-Cath insertion was reviewed today and he is agreeable to proceed.  These complications include bleeding, thrombosis and infection.  3.  Antiemetics: He will receive adequate antiemetics prior to chemotherapy and will have Compazine to use as needed.  4.  Renal function surveillance: His baseline creatinine and creatinine clearance are  normal.  We will monitor that on cisplatin chemotherapy.  5.  Pulmonary function assessment: The risk of pulmonary toxicity associated with bleomycin was reviewed today in detail.  He will need a baseline pulmonary function test before the start of chemotherapy.  This will be measured after each cycle to assess his DLCO.  6.  Prognosis: Anticipate excellent prognosis and long-term disease control associated with this therapy.  7.  Sperm banking: It is unclear if he is desiring to have children in the future.  He has experienced developmental disabilities and information about sperm banking was given to the patient today and he will consider that option.  The risk of infertility is low but has been documented with this chemotherapy regimen.  8.  Follow-up: We will be in the immediate future to start therapy.  40  minutes was spent with the patient face-to-face today.  More than 50% of time was dedicated to patient counseling, education and answering questions regarding diagnosis, prognosis and the nuances associated with treatment complications and management of these issues.    Zola Button, MD 5/29/20194:32 PM

## 2018-05-02 NOTE — Progress Notes (Signed)
START ON PATHWAY REGIMEN - Testicular     A cycle is every 21 days:     Bleomycin      Etoposide      Cisplatin   **Always confirm dose/schedule in your pharmacy ordering system**    Patient Characteristics: Seminoma, Newly Diagnosed, Stage II (Lymph Node Mass > 3 cm or Multifocal Nodes) AJCC T Category: pT2 AJCC N Category: cN2 AJCC M Category: M0 AJCC S Category Post-Orchiectomy: S0 AJCC 8 Stage Grouping: IIB Current evidence of distant metastases<= No Histology: Seminoma  Intent of Therapy: Curative Intent, Discussed with Patient

## 2018-05-05 ENCOUNTER — Other Ambulatory Visit: Payer: Self-pay | Admitting: Radiology

## 2018-05-06 ENCOUNTER — Other Ambulatory Visit: Payer: BLUE CROSS/BLUE SHIELD

## 2018-05-06 ENCOUNTER — Inpatient Hospital Stay: Payer: BLUE CROSS/BLUE SHIELD | Attending: Oncology

## 2018-05-06 DIAGNOSIS — C6291 Malignant neoplasm of right testis, unspecified whether descended or undescended: Secondary | ICD-10-CM | POA: Insufficient documentation

## 2018-05-06 DIAGNOSIS — R1013 Epigastric pain: Secondary | ICD-10-CM | POA: Insufficient documentation

## 2018-05-06 DIAGNOSIS — Z5111 Encounter for antineoplastic chemotherapy: Secondary | ICD-10-CM | POA: Insufficient documentation

## 2018-05-06 DIAGNOSIS — K649 Unspecified hemorrhoids: Secondary | ICD-10-CM | POA: Insufficient documentation

## 2018-05-06 MED ORDER — LIDOCAINE-PRILOCAINE 2.5-2.5 % EX CREA: 1.0000 "application " | TOPICAL_CREAM | CUTANEOUS | 1 refills | Status: DC | PRN

## 2018-05-07 ENCOUNTER — Encounter (HOSPITAL_COMMUNITY): Payer: Self-pay

## 2018-05-07 ENCOUNTER — Ambulatory Visit (HOSPITAL_COMMUNITY)
Admission: RE | Admit: 2018-05-07 | Discharge: 2018-05-07 | Disposition: A | Discharge status: Home / Self Care | Payer: BLUE CROSS/BLUE SHIELD | Source: Ambulatory Visit | Attending: Oncology | Admitting: Oncology

## 2018-05-07 ENCOUNTER — Other Ambulatory Visit: Payer: Self-pay | Admitting: Oncology

## 2018-05-07 DIAGNOSIS — Z9079 Acquired absence of other genital organ(s): Secondary | ICD-10-CM | POA: Insufficient documentation

## 2018-05-07 DIAGNOSIS — F840 Autistic disorder: Secondary | ICD-10-CM | POA: Insufficient documentation

## 2018-05-07 DIAGNOSIS — R590 Localized enlarged lymph nodes: Secondary | ICD-10-CM | POA: Diagnosis not present

## 2018-05-07 DIAGNOSIS — C6291 Malignant neoplasm of right testis, unspecified whether descended or undescended: Secondary | ICD-10-CM | POA: Diagnosis not present

## 2018-05-07 DIAGNOSIS — C6290 Malignant neoplasm of unspecified testis, unspecified whether descended or undescended: Secondary | ICD-10-CM

## 2018-05-07 HISTORY — PX: IR FLUORO GUIDE PORT INSERTION RIGHT: IMG5741

## 2018-05-07 HISTORY — PX: IR US GUIDE VASC ACCESS RIGHT: IMG2390

## 2018-05-07 LAB — CBC WITH DIFFERENTIAL/PLATELET
Basophils Absolute: 0 10*3/uL (ref 0.0–0.1)
Basophils Relative: 1 %
EOS ABS: 0.2 10*3/uL (ref 0.0–0.7)
EOS PCT: 4 %
HCT: 46.6 % (ref 39.0–52.0)
HEMOGLOBIN: 15.6 g/dL (ref 13.0–17.0)
LYMPHS ABS: 2.1 10*3/uL (ref 0.7–4.0)
Lymphocytes Relative: 36 %
MCH: 29.8 pg (ref 26.0–34.0)
MCHC: 33.5 g/dL (ref 30.0–36.0)
MCV: 88.9 fL (ref 78.0–100.0)
MONOS PCT: 11 %
Monocytes Absolute: 0.6 10*3/uL (ref 0.1–1.0)
NEUTROS PCT: 48 %
Neutro Abs: 2.9 10*3/uL (ref 1.7–7.7)
Platelets: 350 10*3/uL (ref 150–400)
RBC: 5.24 MIL/uL (ref 4.22–5.81)
RDW: 12.8 % (ref 11.5–15.5)
WBC: 5.8 10*3/uL (ref 4.0–10.5)

## 2018-05-07 LAB — PROTIME-INR
INR: 1.08
PROTHROMBIN TIME: 13.9 s (ref 11.4–15.2)

## 2018-05-07 MED ORDER — SODIUM CHLORIDE 0.9 % IV SOLN
INTRAVENOUS | Status: DC
  Administered 2018-05-07: 10:00:00 via INTRAVENOUS

## 2018-05-07 MED ORDER — NALOXONE HCL 0.4 MG/ML IJ SOLN
INTRAMUSCULAR | Status: AC
  Filled 2018-05-07: qty 1

## 2018-05-07 MED ORDER — HEPARIN SOD (PORK) LOCK FLUSH 100 UNIT/ML IV SOLN
INTRAVENOUS | Status: AC | PRN
  Administered 2018-05-07: 500 [IU] via INTRAVENOUS

## 2018-05-07 MED ORDER — CEFAZOLIN SODIUM-DEXTROSE 2-4 GM/100ML-% IV SOLN
INTRAVENOUS | Status: AC
  Filled 2018-05-07: qty 100

## 2018-05-07 MED ORDER — HEPARIN SOD (PORK) LOCK FLUSH 100 UNIT/ML IV SOLN
INTRAVENOUS | Status: AC
  Filled 2018-05-07: qty 5

## 2018-05-07 MED ORDER — LIDOCAINE HCL 1 % IJ SOLN
INTRAMUSCULAR | Status: AC
  Filled 2018-05-07: qty 20

## 2018-05-07 MED ORDER — FENTANYL CITRATE (PF) 100 MCG/2ML IJ SOLN
INTRAMUSCULAR | Status: AC
  Filled 2018-05-07: qty 4

## 2018-05-07 MED ORDER — FLUMAZENIL 0.5 MG/5ML IV SOLN
INTRAVENOUS | Status: AC
  Filled 2018-05-07: qty 5

## 2018-05-07 MED ORDER — FENTANYL CITRATE (PF) 100 MCG/2ML IJ SOLN
INTRAMUSCULAR | Status: AC | PRN
  Administered 2018-05-07 (×2): 50 ug via INTRAVENOUS

## 2018-05-07 MED ORDER — CEFAZOLIN SODIUM-DEXTROSE 2-4 GM/100ML-% IV SOLN: 2.0000 g | INTRAVENOUS | Status: DC

## 2018-05-07 MED ORDER — MIDAZOLAM HCL 2 MG/2ML IJ SOLN
INTRAMUSCULAR | Status: AC | PRN
  Administered 2018-05-07 (×4): 1 mg via INTRAVENOUS

## 2018-05-07 MED ORDER — MIDAZOLAM HCL 2 MG/2ML IJ SOLN
INTRAMUSCULAR | Status: AC
  Filled 2018-05-07: qty 4

## 2018-05-07 NOTE — Consult Note (Signed)
Chief Complaint: Patient was seen in consultation today for Port-A-Cath placement  Referring Physician(s): Wyatt Portela  Supervising Physician: Marybelle Killings  Patient Status: [facility] - Out-pt  History of Present Illness: Greg Peterson is a 22 y.o. male with history of autism and right testicular seminoma diagnosed in February of this year with prior orchiectomy who presents now with recurrent disease/retroperitoneal adenopathy.  Request received for Port-A-Cath placement for chemotherapy.  Past Medical History:  Diagnosis Date  . Autism spectrum disorder     Past Surgical History:  Procedure Laterality Date  . ORCHIECTOMY Right 01/04/2018   Procedure: RIGHT RADICAL ORCHIECTOMY;  Surgeon: Irine Seal, MD;  Location: [facility];  Service: Urology;  Laterality: Right;    Allergies: Patient has no known allergies.  Medications: Prior to Admission medications   Medication Sig Start Date End Date Taking? Authorizing Provider  citalopram (CELEXA) 10 MG tablet Take 10 mg by mouth daily.   Yes [provider]  ibuprofen (ADVIL,MOTRIN) 200 MG tablet Take 200 mg by mouth every 6 (six) hours as needed.   Yes [provider]  HYDROcodone-acetaminophen (NORCO) 5-325 MG tablet Take 1 tablet by mouth every 6 (six) hours as needed for moderate pain. Patient not taking: Reported on 01/30/2018 01/04/18   Irine Seal, MD  lidocaine-prilocaine (EMLA) cream Apply 1 application topically as needed. Apply to port area 1-2 hours before coming to [place] for treatment 05/06/18   Wyatt Portela, MD  prochlorperazine (COMPAZINE) 10 MG tablet Take 1 tablet (10 mg total) by mouth every 6 (six) hours as needed for nausea or vomiting. 05/01/18   Wyatt Portela, MD     History reviewed. No pertinent family history.  Social History   Socioeconomic History  . Marital status: Single    Spouse name: Not on file  . Number of children: Not on file  . Years of education:  Not on file  . Highest education level: Not on file  Occupational History  . Not on file  Social Needs  . Financial resource strain: Not on file  . Food insecurity:    Worry: Not on file    Inability: Not on file  . Transportation needs:    Medical: Not on file    Non-medical: Not on file  Tobacco Use  . Smoking status: Never Smoker  . Smokeless tobacco: Never Used  Substance and Sexual Activity  . Alcohol use: No    Frequency: Never  . Drug use: No  . Sexual activity: Not on file  Lifestyle  . Physical activity:    Days per week: Not on file    Minutes per session: Not on file  . Stress: Not on file  Relationships  . Social connections:    Talks on phone: Not on file    Gets together: Not on file    Attends religious service: Not on file    Active member of club or organization: Not on file    Attends meetings of clubs or organizations: Not on file    Relationship status: Not on file  Other Topics Concern  . Not on file  Social History Narrative  . Not on file     Review of Systems denies fever, chest pain, dyspnea, cough, abdominal pain, nausea, vomiting or bleeding.  He does have occasional headaches and intermittent back pain  Vital Signs: BP 127/83 (BP Location: Right Arm)   Pulse 72   Temp (!) 97.5 F (36.4 C) (Oral)  Resp 18   SpO2 99%   Physical Exam awake, alert.  Chest clear to auscultation bilaterally.  Heart with regular rate and rhythm.  Abdomen soft, positive bowel sounds, nontender.  No lower extremity edema.  Imaging: Ct Abdomen Pelvis W Contrast  Result Date: 04/17/2018 CLINICAL DATA:  Follow-up right testicular carcinoma. Status post right orchiectomy. EXAM: CT ABDOMEN AND PELVIS WITH CONTRAST TECHNIQUE: Multidetector CT imaging of the abdomen and pelvis was performed using the standard protocol following bolus administration of intravenous contrast. CONTRAST:  173mL OMNIPAQUE IOHEXOL 300 MG/ML  SOLN COMPARISON:  01/09/2018 from [place]: Lower Chest: No acute findings. Hepatobiliary: No hepatic masses identified. Gallbladder is unremarkable. Pancreas:  No mass or inflammatory changes. Spleen: Within normal limits in size and appearance. Adrenals/Urinary Tract: No masses identified. No evidence of hydronephrosis. Stomach/Bowel: No evidence of obstruction, inflammatory process or abnormal fluid collections. Normal appendix visualized. Vascular/Lymphatic: New retroperitoneal lymphadenopathy is seen in the aortocaval space, with largest lymph node measuring 3.2 cm on image 43/2. No other sites of lymphadenopathy identified. Reproductive:  No mass or other significant abnormality. Other:  Expected postop changes from right orchiectomy. Musculoskeletal:  No suspicious bone lesions identified. IMPRESSION: New abdominal retroperitoneal lymphadenopathy in the aorto-caval space, consistent with metastatic disease. No other sites of abdominal or pelvic metastatic disease identified. These results will be called to the ordering clinician or representative by the Radiologist Assistant, and communication documented in the PACS or zVision Dashboard. Electronically Signed   By: Earle Gell M.D.   On: 04/17/2018 15:47    Labs:  CBC: Recent Labs    04/24/18 1106 05/07/18 0946  WBC 5.5 5.8  HGB 15.6 15.6  HCT 46.2 46.6  PLT 339 350    COAGS: Recent Labs    01/03/18 1530 05/07/18 0946  INR 1.06 1.08  APTT 32  --     BMP: Recent Labs    04/24/18 1106  NA 143  K 3.9  CL 108  CO2 26  GLUCOSE 80  BUN 13  CALCIUM 9.6  CREATININE 1.18  GFRNONAA >60  GFRAA >60    LIVER FUNCTION TESTS: Recent Labs    04/24/18 1106  BILITOT 0.9  AST 27  ALT 37  ALKPHOS 104  PROT 7.7  ALBUMIN 4.0    TUMOR MARKERS: No results for input(s): AFPTM, CEA, CA199, CHROMGRNA in the last 8760 hours.  Assessment and Plan: 22 y.o. male with history of autism and right testicular seminoma diagnosed in February of this year with prior  orchiectomy who presents now with recurrent disease/retroperitoneal adenopathy.  Request received for Port-A-Cath placement for chemotherapy.Risks and benefits of image guided port-a-catheter placement was discussed with the patient/family including, but not limited to bleeding, infection, pneumothorax, or fibrin sheath development and need for additional procedures.  All of the patient's questions were answered, patient is agreeable to proceed. Consent signed and in chart.     Thank you for this interesting consult.  I greatly enjoyed meeting Greg Peterson and look forward to participating in their care.  A copy of this report was sent to the requesting provider on this date.  Electronically Signed: D. Rowe Robert, PA-C 05/07/2018, 10:15 AM   I spent a total of 25 minutes  in face to face in clinical consultation, greater than 50% of which was counseling/coordinating care for Port-A-Cath placement

## 2018-05-07 NOTE — Discharge Instructions (Addendum)
Moderate Conscious Sedation, Adult, Care After °These instructions provide you with information about caring for yourself after your procedure. Your health care provider may also give you more specific instructions. Your treatment has been planned according to current medical practices, but problems sometimes occur. Call your health care provider if you have any problems or questions after your procedure. °What can I expect after the procedure? °After your procedure, it is common: °· To feel sleepy for several hours. °· To feel clumsy and have poor balance for several hours. °· To have poor judgment for several hours. °· To vomit if you eat too soon. ° °Follow these instructions at home: °For at least 24 hours after the procedure: ° °· Do not: °? Participate in activities where you could fall or become injured. °? Drive. °? Use heavy machinery. °? Drink alcohol. °? Take sleeping pills or medicines that cause drowsiness. °? Make important decisions or sign legal documents. °? Take care of children on your own. °· Rest. °Eating and drinking °· Follow the diet recommended by your health care provider. °· If you vomit: °? Drink water, juice, or soup when you can drink without vomiting. °? Make sure you have little or no nausea before eating solid foods. °General instructions °· Have a responsible adult stay with you until you are awake and alert. °· Take over-the-counter and prescription medicines only as told by your health care provider. °· If you smoke, do not smoke without supervision. °· Keep all follow-up visits as told by your health care provider. This is important. °Contact a health care provider if: °· You keep feeling nauseous or you keep vomiting. °· You feel light-headed. °· You develop a rash. °· You have a fever. °Get help right away if: °· You have trouble breathing. °This information is not intended to replace advice given to you by your health care provider. Make sure you discuss any questions you have  with your health care provider. °Document Released: 09/10/2013 Document Revised: 04/24/2016 Document Reviewed: 03/11/2016 °Elsevier Interactive Patient Education © 2018 [employer] °Implanted Port Home Guide °An implanted port is a type of central line that is placed under the skin. Central lines are used to provide IV access when treatment or nutrition needs to be given through a person’s veins. Implanted ports are used for long-term IV access. An implanted port may be placed because: °· You need IV medicine that would be irritating to the small veins in your hands or arms. °· You need long-term IV medicines, such as antibiotics. °· You need IV nutrition for a long period. °· You need frequent blood draws for lab tests. °· You need dialysis. ° °Implanted ports are usually placed in the chest area, but they can also be placed in the upper arm, the abdomen, or the leg. An implanted port has two main parts: °· Reservoir. The reservoir is round and will appear as a small, raised area under your skin. The reservoir is the part where a needle is inserted to give medicines or draw blood. °· Catheter. The catheter is a thin, flexible tube that extends from the reservoir. The catheter is placed into a large vein. Medicine that is inserted into the reservoir goes into the catheter and then into the vein. ° °How will I care for my incision site? °Do not get the incision site wet. Bathe or shower as directed by your health care provider. °How is my port accessed? °Special steps must be taken to access the port: °·   Before the port is accessed, a numbing cream can be placed on the skin. This helps numb the skin over the port site. °· Your health care provider uses a sterile technique to access the port. °? Your health care provider must put on a mask and sterile gloves. °? The skin over your port is cleaned carefully with an antiseptic and allowed to dry. °? The port is gently pinched between sterile gloves, and a needle is  inserted into the port. °· Only "non-coring" port needles should be used to access the port. Once the port is accessed, a blood return should be checked. This helps ensure that the port is in the vein and is not clogged. °· If your port needs to remain accessed for a constant infusion, a clear (transparent) bandage will be placed over the needle site. The bandage and needle will need to be changed every week, or as directed by your health care provider. °· Keep the bandage covering the needle clean and dry. Do not get it wet. Follow your health care provider’s instructions on how to take a shower or bath while the port is accessed. °· If your port does not need to stay accessed, no bandage is needed over the port. ° °What is flushing? °Flushing helps keep the port from getting clogged. Follow your health care provider’s instructions on how and when to flush the port. Ports are usually flushed with saline solution or a medicine called heparin. The need for flushing will depend on how the port is used. °· If the port is used for intermittent medicines or blood draws, the port will need to be flushed: °? After medicines have been given. °? After blood has been drawn. °? As part of routine maintenance. °· If a constant infusion is running, the port may not need to be flushed. ° °How long will my port stay implanted? °The port can stay in for as long as your health care provider thinks it is needed. When it is time for the port to come out, surgery will be done to remove it. The procedure is similar to the one performed when the port was put in. °When should I seek immediate medical care? °When you have an implanted port, you should seek immediate medical care if: °· You notice a bad smell coming from the incision site. °· You have swelling, redness, or drainage at the incision site. °· You have more swelling or pain at the port site or the surrounding area. °· You have a fever that is not controlled with  medicine. ° °This information is not intended to replace advice given to you by your health care provider. Make sure you discuss any questions you have with your health care provider. °Document Released: 11/20/2005 Document Revised: 04/27/2016 Document Reviewed: 07/28/2013 °Elsevier Interactive Patient Education © 2017 [employer] Inc. ° °

## 2018-05-07 NOTE — Procedures (Signed)
RIJV PAC SVC RA EBL 0 Comp 0 

## 2018-05-14 ENCOUNTER — Ambulatory Visit (HOSPITAL_COMMUNITY)
Admission: RE | Admit: 2018-05-14 | Discharge: 2018-05-14 | Disposition: A | Discharge status: Home / Self Care | Payer: BLUE CROSS/BLUE SHIELD | Source: Ambulatory Visit | Attending: Oncology | Admitting: Oncology

## 2018-05-14 DIAGNOSIS — C6290 Malignant neoplasm of unspecified testis, unspecified whether descended or undescended: Secondary | ICD-10-CM | POA: Insufficient documentation

## 2018-05-14 LAB — PULMONARY FUNCTION TEST
DL/VA % pred: 74 %
DL/VA: 3.56 ml/min/mmHg/L
DLCO COR % PRED: 78 %
DLCO COR: 26.32 ml/min/mmHg
DLCO unc % pred: 80 %
DLCO unc: 27.04 ml/min/mmHg
FEF 25-75 Pre: 5.63 L/sec
FEF2575-%PRED-PRE: 114 %
FEV1-%Pred-Pre: 111 %
FEV1-PRE: 5.31 L
FEV1FVC-%Pred-Pre: 101 %
FEV6-%PRED-PRE: 110 %
FEV6-Pre: 6.3 L
FEV6FVC-%Pred-Pre: 101 %
FVC-%PRED-PRE: 109 %
FVC-PRE: 6.3 L
PRE FEV6/FVC RATIO: 100 %
Pre FEV1/FVC ratio: 84 %

## 2018-05-14 NOTE — Progress Notes (Signed)
Respiratory Care Note/ PFT Pt in for PFT study.  Only able to do PFT with DLCO.  Pleth portion not done.  Pt had difficulty the mouth piece and the nose clip  Gagging and irritated with nose clip.. Had to coach several different way and take may breaks through out the study to get it done. Only one DLCO attempt obtained.  Pt gave his best effort.

## 2018-05-20 ENCOUNTER — Inpatient Hospital Stay (HOSPITAL_BASED_OUTPATIENT_CLINIC_OR_DEPARTMENT_OTHER): Payer: BLUE CROSS/BLUE SHIELD | Admitting: Medical

## 2018-05-20 ENCOUNTER — Inpatient Hospital Stay: Payer: BLUE CROSS/BLUE SHIELD

## 2018-05-20 VITALS — BP 124/89 | HR 74 | Temp 97.8°F | Resp 16

## 2018-05-20 DIAGNOSIS — C6291 Malignant neoplasm of right testis, unspecified whether descended or undescended: Secondary | ICD-10-CM | POA: Diagnosis not present

## 2018-05-20 DIAGNOSIS — C6290 Malignant neoplasm of unspecified testis, unspecified whether descended or undescended: Secondary | ICD-10-CM

## 2018-05-20 DIAGNOSIS — K644 Residual hemorrhoidal skin tags: Secondary | ICD-10-CM | POA: Diagnosis not present

## 2018-05-20 DIAGNOSIS — Z5111 Encounter for antineoplastic chemotherapy: Secondary | ICD-10-CM | POA: Diagnosis present

## 2018-05-20 LAB — CBC WITH DIFFERENTIAL (CANCER CENTER ONLY)
Basophils Absolute: 0 10*3/uL (ref 0.0–0.1)
Basophils Relative: 0 %
EOS ABS: 0.3 10*3/uL (ref 0.0–0.5)
EOS PCT: 4 %
HCT: 45.8 % (ref 38.4–49.9)
HEMOGLOBIN: 15.6 g/dL (ref 13.0–17.1)
Lymphocytes Relative: 37 %
Lymphs Abs: 2.3 10*3/uL (ref 0.9–3.3)
MCH: 30.1 pg (ref 27.2–33.4)
MCHC: 34.1 g/dL (ref 32.0–36.0)
MCV: 88.2 fL (ref 79.3–98.0)
Monocytes Absolute: 0.9 10*3/uL (ref 0.1–0.9)
Monocytes Relative: 13 %
NEUTROS PCT: 46 %
Neutro Abs: 2.9 10*3/uL (ref 1.5–6.5)
PLATELETS: 221 10*3/uL (ref 140–400)
RBC: 5.19 MIL/uL (ref 4.20–5.82)
RDW: 12.9 % (ref 11.0–14.6)
WBC: 6.3 10*3/uL (ref 4.0–10.3)

## 2018-05-20 LAB — CMP (CANCER CENTER ONLY)
ALT: 34 U/L (ref 0–55)
AST: 22 U/L (ref 5–34)
Albumin: 4.3 g/dL (ref 3.5–5.0)
Alkaline Phosphatase: 84 U/L (ref 40–150)
Anion gap: 8 (ref 3–11)
BILIRUBIN TOTAL: 0.9 mg/dL (ref 0.2–1.2)
BUN: 20 mg/dL (ref 7–26)
CHLORIDE: 105 mmol/L (ref 98–109)
CO2: 24 mmol/L (ref 22–29)
CREATININE: 1.06 mg/dL (ref 0.70–1.30)
Calcium: 9.4 mg/dL (ref 8.4–10.4)
GFR, Estimated: >60 mL/min (ref >60)
Glucose, Bld: 98 mg/dL (ref 70–140)
POTASSIUM: 4.1 mmol/L (ref 3.5–5.1)
Sodium: 137 mmol/L (ref 136–145)
TOTAL PROTEIN: 7.1 g/dL (ref 6.4–8.3)

## 2018-05-20 MED ORDER — HEPARIN SOD (PORK) LOCK FLUSH 100 UNIT/ML IV SOLN
500.0000 [IU] | Freq: Once | INTRAVENOUS | Status: AC | PRN
  Administered 2018-05-20: 500 [IU]
  Filled 2018-05-20: qty 5

## 2018-05-20 MED ORDER — SODIUM CHLORIDE 0.9 % IV SOLN
20.0000 mg/m2 | Freq: Once | INTRAVENOUS | Status: AC
  Administered 2018-05-20: 41 mg via INTRAVENOUS
  Filled 2018-05-20: qty 41

## 2018-05-20 MED ORDER — SODIUM CHLORIDE 0.9% FLUSH
10.0000 mL | INTRAVENOUS | Status: DC | PRN
  Administered 2018-05-20: 10 mL
  Filled 2018-05-20: qty 10

## 2018-05-20 MED ORDER — PALONOSETRON HCL INJECTION 0.25 MG/5ML
0.2500 mg | Freq: Once | INTRAVENOUS | Status: AC
  Administered 2018-05-20: 0.25 mg via INTRAVENOUS

## 2018-05-20 MED ORDER — SODIUM CHLORIDE 0.9 % IV SOLN
Freq: Once | INTRAVENOUS | Status: AC
  Administered 2018-05-20: 12:00:00 via INTRAVENOUS
  Filled 2018-05-20: qty 5

## 2018-05-20 MED ORDER — PALONOSETRON HCL INJECTION 0.25 MG/5ML
INTRAVENOUS | Status: AC
  Filled 2018-05-20: qty 5

## 2018-05-20 MED ORDER — POTASSIUM CHLORIDE 2 MEQ/ML IV SOLN
Freq: Once | INTRAVENOUS | Status: AC
  Administered 2018-05-20: 10:00:00 via INTRAVENOUS
  Filled 2018-05-20: qty 10

## 2018-05-20 MED ORDER — HYDROCORTISONE 2.5 % RE CREA: 1.0000 "application " | TOPICAL_CREAM | Freq: Two times a day (BID) | RECTAL | 2 refills | Status: DC

## 2018-05-20 MED ORDER — SODIUM CHLORIDE 0.9 % IV SOLN
Freq: Once | INTRAVENOUS | Status: AC
  Administered 2018-05-20: 10:00:00 via INTRAVENOUS

## 2018-05-20 MED ORDER — SODIUM CHLORIDE 0.9 % IV SOLN
98.0000 mg/m2 | Freq: Once | INTRAVENOUS | Status: AC
  Administered 2018-05-20: 200 mg via INTRAVENOUS
  Filled 2018-05-20: qty 10

## 2018-05-20 NOTE — Patient Instructions (Signed)
[facility] Discharge Instructions for Patients Receiving Chemotherapy  Today you received the following chemotherapy agents: Cisplatin and Etoposide.  To help prevent nausea and vomiting after your treatment, we encourage you to take your nausea medication as prescribed.   If you develop nausea and vomiting that is not controlled by your nausea medication, call the clinic.   BELOW ARE SYMPTOMS THAT SHOULD BE REPORTED IMMEDIATELY:  *FEVER GREATER THAN 100.5 F  *CHILLS WITH OR WITHOUT FEVER  NAUSEA AND VOMITING THAT IS NOT CONTROLLED WITH YOUR NAUSEA MEDICATION  *UNUSUAL SHORTNESS OF BREATH  *UNUSUAL BRUISING OR BLEEDING  TENDERNESS IN MOUTH AND THROAT WITH OR WITHOUT PRESENCE OF ULCERS  *URINARY PROBLEMS  *BOWEL PROBLEMS  UNUSUAL RASH Items with * indicate a potential emergency and should be followed up as soon as possible.  Feel free to call the clinic should you have any questions or concerns. The clinic phone number is [phone].  Please show the CHEMO ALERT CARD at check-in to the Emergency Department and triage nurse.   Cisplatin injection What is this medicine? CISPLATIN (SIS pla tin) is a chemotherapy drug. It targets fast dividing cells, like cancer cells, and causes these cells to die. This medicine is used to treat many types of cancer like bladder, ovarian, and testicular cancers. This medicine may be used for other purposes; ask your health care provider or pharmacist if you have questions. COMMON BRAND NAME(S): Platinol, Platinol -AQ What should I tell my health care provider before I take this medicine? They need to know if you have any of these conditions: -blood disorders -hearing problems -kidney disease -recent or ongoing radiation therapy -an unusual or allergic reaction to cisplatin, carboplatin, other chemotherapy, other medicines, foods, dyes, or preservatives -pregnant or trying to get pregnant -breast-feeding How should I use  this medicine? This drug is given as an infusion into a vein. It is administered in a hospital or clinic by a specially trained health care professional. Talk to your pediatrician regarding the use of this medicine in children. Special care may be needed. Overdosage: If you think you have taken too much of this medicine contact a poison control center or emergency room at once. NOTE: This medicine is only for you. Do not share this medicine with others. What if I miss a dose? It is important not to miss a dose. Call your doctor or health care professional if you are unable to keep an appointment. What may interact with this medicine? -dofetilide -foscarnet -medicines for seizures -medicines to increase blood counts like filgrastim, pegfilgrastim, sargramostim -probenecid -pyridoxine used with altretamine -rituximab -some antibiotics like amikacin, gentamicin, neomycin, polymyxin B, streptomycin, tobramycin -sulfinpyrazone -vaccines -zalcitabine Talk to your doctor or health care professional before taking any of these medicines: -acetaminophen -aspirin -ibuprofen -ketoprofen -naproxen This list may not describe all possible interactions. Give your health care provider a list of all the medicines, herbs, non-prescription drugs, or dietary supplements you use. Also tell them if you smoke, drink alcohol, or use illegal drugs. Some items may interact with your medicine. What should I watch for while using this medicine? Your condition will be monitored carefully while you are receiving this medicine. You will need important blood work done while you are taking this medicine. This drug may make you feel generally unwell. This is not uncommon, as chemotherapy can affect healthy cells as well as cancer cells. Report any side effects. Continue your course of treatment even though you feel ill unless your doctor tells   you to stop. In some cases, you may be given additional medicines to help with  side effects. Follow all directions for their use. Call your doctor or health care professional for advice if you get a fever, chills or sore throat, or other symptoms of a cold or flu. Do not treat yourself. This drug decreases your body's ability to fight infections. Try to avoid being around people who are sick. This medicine may increase your risk to bruise or bleed. Call your doctor or health care professional if you notice any unusual bleeding. Be careful brushing and flossing your teeth or using a toothpick because you may get an infection or bleed more easily. If you have any dental work done, tell your dentist you are receiving this medicine. Avoid taking products that contain aspirin, acetaminophen, ibuprofen, naproxen, or ketoprofen unless instructed by your doctor. These medicines may hide a fever. Do not become pregnant while taking this medicine. Women should inform their doctor if they wish to become pregnant or think they might be pregnant. There is a potential for serious side effects to an unborn child. Talk to your health care professional or pharmacist for more information. Do not breast-feed an infant while taking this medicine. Drink fluids as directed while you are taking this medicine. This will help protect your kidneys. Call your doctor or health care professional if you get diarrhea. Do not treat yourself. What side effects may I notice from receiving this medicine? Side effects that you should report to your doctor or health care professional as soon as possible: -allergic reactions like skin rash, itching or hives, swelling of the face, lips, or tongue -signs of infection - fever or chills, cough, sore throat, pain or difficulty passing urine -signs of decreased platelets or bleeding - bruising, pinpoint red spots on the skin, black, tarry stools, nosebleeds -signs of decreased red blood cells - unusually weak or tired, fainting spells, lightheadedness -breathing  problems -changes in hearing -gout pain -low blood counts - This drug may decrease the number of white blood cells, red blood cells and platelets. You may be at increased risk for infections and bleeding. -nausea and vomiting -pain, swelling, redness or irritation at the injection site -pain, tingling, numbness in the hands or feet -problems with balance, movement -trouble passing urine or change in the amount of urine Side effects that usually do not require medical attention (report to your doctor or health care professional if they continue or are bothersome): -changes in vision -loss of appetite -metallic taste in the mouth or changes in taste This list may not describe all possible side effects. Call your doctor for medical advice about side effects. You may report side effects to FDA at 1-800-FDA-1088. Where should I keep my medicine? This drug is given in a hospital or clinic and will not be stored at home. NOTE: This sheet is a summary. It may not cover all possible information. If you have questions about this medicine, talk to your doctor, pharmacist, or health care provider.  2018 [employer] Standard (2008-02-25 14:40:54)   Etoposide, VP-16 injection What is this medicine? ETOPOSIDE, VP-16 (e toe POE side) is a chemotherapy drug. It is used to treat testicular cancer, lung cancer, and other cancers. This medicine may be used for other purposes; ask your health care provider or pharmacist if you have questions. COMMON BRAND NAME(S): Etopophos, Toposar, VePesid What should I tell my health care provider before I take this medicine? They need to know if you have any   of these conditions: -infection -kidney disease -liver disease -low blood counts, like low white cell, platelet, or red cell counts -an unusual or allergic reaction to etoposide, other medicines, foods, dyes, or preservatives -pregnant or trying to get pregnant -breast-feeding How should I use this  medicine? This medicine is for infusion into a vein. It is administered in a hospital or clinic by a specially trained health care professional. Talk to your pediatrician regarding the use of this medicine in children. Special care may be needed. Overdosage: If you think you have taken too much of this medicine contact a poison control center or emergency room at once. NOTE: This medicine is only for you. Do not share this medicine with others. What if I miss a dose? It is important not to miss your dose. Call your doctor or health care professional if you are unable to keep an appointment. What may interact with this medicine? -aspirin -certain medications for seizures like carbamazepine, phenobarbital, phenytoin, valproic acid -cyclosporine -levamisole -warfarin This list may not describe all possible interactions. Give your health care provider a list of all the medicines, herbs, non-prescription drugs, or dietary supplements you use. Also tell them if you smoke, drink alcohol, or use illegal drugs. Some items may interact with your medicine. What should I watch for while using this medicine? Visit your doctor for checks on your progress. This drug may make you feel generally unwell. This is not uncommon, as chemotherapy can affect healthy cells as well as cancer cells. Report any side effects. Continue your course of treatment even though you feel ill unless your doctor tells you to stop. In some cases, you may be given additional medicines to help with side effects. Follow all directions for their use. Call your doctor or health care professional for advice if you get a fever, chills or sore throat, or other symptoms of a cold or flu. Do not treat yourself. This drug decreases your body's ability to fight infections. Try to avoid being around people who are sick. This medicine may increase your risk to bruise or bleed. Call your doctor or health care professional if you notice any unusual  bleeding. Talk to your doctor about your risk of cancer. You may be more at risk for certain types of cancers if you take this medicine. Do not become pregnant while taking this medicine or for at least 6 months after stopping it. Women should inform their doctor if they wish to become pregnant or think they might be pregnant. Women of child-bearing potential will need to have a negative pregnancy test before starting this medicine. There is a potential for serious side effects to an unborn child. Talk to your health care professional or pharmacist for more information. Do not breast-feed an infant while taking this medicine. Men must use a latex condom during sexual contact with a woman while taking this medicine and for at least 4 months after stopping it. A latex condom is needed even if you have had a vasectomy. Contact your doctor right away if your partner becomes pregnant. Do not donate sperm while taking this medicine and for at least 4 months after you stop taking this medicine. Men should inform their doctors if they wish to father a child. This medicine may lower sperm counts. What side effects may I notice from receiving this medicine? Side effects that you should report to your doctor or health care professional as soon as possible: -allergic reactions like skin rash, itching or hives, swelling of   the face, lips, or tongue -low blood counts - this medicine may decrease the number of white blood cells, red blood cells and platelets. You may be at increased risk for infections and bleeding. -signs of infection - fever or chills, cough, sore throat, pain or difficulty passing urine -signs of decreased platelets or bleeding - bruising, pinpoint red spots on the skin, black, tarry stools, blood in the urine -signs of decreased red blood cells - unusually weak or tired, fainting spells, lightheadedness -breathing problems -changes in vision -mouth or throat sores or ulcers -pain, redness,  swelling or irritation at the injection site -pain, tingling, numbness in the hands or feet -redness, blistering, peeling or loosening of the skin, including inside the mouth -seizures -vomiting Side effects that usually do not require medical attention (report to your doctor or health care professional if they continue or are bothersome): -diarrhea -hair loss -loss of appetite -nausea -stomach pain This list may not describe all possible side effects. Call your doctor for medical advice about side effects. You may report side effects to FDA at 1-800-FDA-1088. Where should I keep my medicine? This drug is given in a hospital or clinic and will not be stored at home. NOTE: This sheet is a summary. It may not cover all possible information. If you have questions about this medicine, talk to your doctor, pharmacist, or health care provider.  2018 [employer] Standard (2015-11-12 11:53:23)       

## 2018-05-21 ENCOUNTER — Inpatient Hospital Stay: Payer: BLUE CROSS/BLUE SHIELD

## 2018-05-21 VITALS — BP 144/86 | HR 91 | Temp 97.8°F | Resp 16

## 2018-05-21 DIAGNOSIS — C6290 Malignant neoplasm of unspecified testis, unspecified whether descended or undescended: Secondary | ICD-10-CM

## 2018-05-21 DIAGNOSIS — C6291 Malignant neoplasm of right testis, unspecified whether descended or undescended: Secondary | ICD-10-CM | POA: Diagnosis not present

## 2018-05-21 MED ORDER — DEXAMETHASONE SODIUM PHOSPHATE 10 MG/ML IJ SOLN
10.0000 mg | Freq: Once | INTRAMUSCULAR | Status: AC
  Administered 2018-05-21: 10 mg via INTRAVENOUS

## 2018-05-21 MED ORDER — SODIUM CHLORIDE 0.9 % IV SOLN
200.0000 mg | Freq: Once | INTRAVENOUS | Status: AC
  Administered 2018-05-21: 200 mg via INTRAVENOUS
  Filled 2018-05-21: qty 10

## 2018-05-21 MED ORDER — CISPLATIN CHEMO INJECTION 100MG/100ML
20.0000 mg/m2 | Freq: Once | INTRAVENOUS | Status: AC
  Administered 2018-05-21: 41 mg via INTRAVENOUS
  Filled 2018-05-21: qty 41

## 2018-05-21 MED ORDER — SODIUM CHLORIDE 0.9% FLUSH
10.0000 mL | INTRAVENOUS | Status: DC | PRN
  Administered 2018-05-21: 10 mL
  Filled 2018-05-21: qty 10

## 2018-05-21 MED ORDER — DEXTROSE-NACL 5-0.45 % IV SOLN
Freq: Once | INTRAVENOUS | Status: AC
  Administered 2018-05-21: 10:00:00 via INTRAVENOUS
  Filled 2018-05-21: qty 10

## 2018-05-21 MED ORDER — HEPARIN SOD (PORK) LOCK FLUSH 100 UNIT/ML IV SOLN
500.0000 [IU] | Freq: Once | INTRAVENOUS | Status: AC | PRN
  Administered 2018-05-21: 500 [IU]
  Filled 2018-05-21: qty 5

## 2018-05-21 MED ORDER — SODIUM CHLORIDE 0.9 % IV SOLN
Freq: Once | INTRAVENOUS | Status: AC
Start: 2018-05-21 — End: 2018-05-21
  Administered 2018-05-21: 10:00:00 via INTRAVENOUS

## 2018-05-21 MED ORDER — DEXAMETHASONE SODIUM PHOSPHATE 10 MG/ML IJ SOLN
INTRAMUSCULAR | Status: AC
  Filled 2018-05-21: qty 1

## 2018-05-21 MED ORDER — SODIUM CHLORIDE 0.9 % IV SOLN
30.0000 [IU] | Freq: Once | INTRAVENOUS | Status: AC
  Administered 2018-05-21: 30 [IU] via INTRAVENOUS
  Filled 2018-05-21: qty 10

## 2018-05-21 NOTE — Patient Instructions (Signed)
[facility] Discharge Instructions for Patients Receiving Chemotherapy  Today you received the following chemotherapy agents: Cisplatin and Etoposide.  To help prevent nausea and vomiting after your treatment, we encourage you to take your nausea medication as prescribed.   If you develop nausea and vomiting that is not controlled by your nausea medication, call the clinic.   BELOW ARE SYMPTOMS THAT SHOULD BE REPORTED IMMEDIATELY:  *FEVER GREATER THAN 100.5 F  *CHILLS WITH OR WITHOUT FEVER  NAUSEA AND VOMITING THAT IS NOT CONTROLLED WITH YOUR NAUSEA MEDICATION  *UNUSUAL SHORTNESS OF BREATH  *UNUSUAL BRUISING OR BLEEDING  TENDERNESS IN MOUTH AND THROAT WITH OR WITHOUT PRESENCE OF ULCERS  *URINARY PROBLEMS  *BOWEL PROBLEMS  UNUSUAL RASH Items with * indicate a potential emergency and should be followed up as soon as possible.  Feel free to call the clinic should you have any questions or concerns. The clinic phone number is [phone].  Please show the CHEMO ALERT CARD at check-in to the Emergency Department and triage nurse.   Cisplatin injection What is this medicine? CISPLATIN (SIS pla tin) is a chemotherapy drug. It targets fast dividing cells, like cancer cells, and causes these cells to die. This medicine is used to treat many types of cancer like bladder, ovarian, and testicular cancers. This medicine may be used for other purposes; ask your health care provider or pharmacist if you have questions. COMMON BRAND NAME(S): Platinol, Platinol -AQ What should I tell my health care provider before I take this medicine? They need to know if you have any of these conditions: -blood disorders -hearing problems -kidney disease -recent or ongoing radiation therapy -an unusual or allergic reaction to cisplatin, carboplatin, other chemotherapy, other medicines, foods, dyes, or preservatives -pregnant or trying to get pregnant -breast-feeding How should I use  this medicine? This drug is given as an infusion into a vein. It is administered in a hospital or clinic by a specially trained health care professional. Talk to your pediatrician regarding the use of this medicine in children. Special care may be needed. Overdosage: If you think you have taken too much of this medicine contact a poison control center or emergency room at once. NOTE: This medicine is only for you. Do not share this medicine with others. What if I miss a dose? It is important not to miss a dose. Call your doctor or health care professional if you are unable to keep an appointment. What may interact with this medicine? -dofetilide -foscarnet -medicines for seizures -medicines to increase blood counts like filgrastim, pegfilgrastim, sargramostim -probenecid -pyridoxine used with altretamine -rituximab -some antibiotics like amikacin, gentamicin, neomycin, polymyxin B, streptomycin, tobramycin -sulfinpyrazone -vaccines -zalcitabine Talk to your doctor or health care professional before taking any of these medicines: -acetaminophen -aspirin -ibuprofen -ketoprofen -naproxen This list may not describe all possible interactions. Give your health care provider a list of all the medicines, herbs, non-prescription drugs, or dietary supplements you use. Also tell them if you smoke, drink alcohol, or use illegal drugs. Some items may interact with your medicine. What should I watch for while using this medicine? Your condition will be monitored carefully while you are receiving this medicine. You will need important blood work done while you are taking this medicine. This drug may make you feel generally unwell. This is not uncommon, as chemotherapy can affect healthy cells as well as cancer cells. Report any side effects. Continue your course of treatment even though you feel ill unless your doctor tells   you to stop. In some cases, you may be given additional medicines to help with  side effects. Follow all directions for their use. Call your doctor or health care professional for advice if you get a fever, chills or sore throat, or other symptoms of a cold or flu. Do not treat yourself. This drug decreases your body's ability to fight infections. Try to avoid being around people who are sick. This medicine may increase your risk to bruise or bleed. Call your doctor or health care professional if you notice any unusual bleeding. Be careful brushing and flossing your teeth or using a toothpick because you may get an infection or bleed more easily. If you have any dental work done, tell your dentist you are receiving this medicine. Avoid taking products that contain aspirin, acetaminophen, ibuprofen, naproxen, or ketoprofen unless instructed by your doctor. These medicines may hide a fever. Do not become pregnant while taking this medicine. Women should inform their doctor if they wish to become pregnant or think they might be pregnant. There is a potential for serious side effects to an unborn child. Talk to your health care professional or pharmacist for more information. Do not breast-feed an infant while taking this medicine. Drink fluids as directed while you are taking this medicine. This will help protect your kidneys. Call your doctor or health care professional if you get diarrhea. Do not treat yourself. What side effects may I notice from receiving this medicine? Side effects that you should report to your doctor or health care professional as soon as possible: -allergic reactions like skin rash, itching or hives, swelling of the face, lips, or tongue -signs of infection - fever or chills, cough, sore throat, pain or difficulty passing urine -signs of decreased platelets or bleeding - bruising, pinpoint red spots on the skin, black, tarry stools, nosebleeds -signs of decreased red blood cells - unusually weak or tired, fainting spells, lightheadedness -breathing  problems -changes in hearing -gout pain -low blood counts - This drug may decrease the number of white blood cells, red blood cells and platelets. You may be at increased risk for infections and bleeding. -nausea and vomiting -pain, swelling, redness or irritation at the injection site -pain, tingling, numbness in the hands or feet -problems with balance, movement -trouble passing urine or change in the amount of urine Side effects that usually do not require medical attention (report to your doctor or health care professional if they continue or are bothersome): -changes in vision -loss of appetite -metallic taste in the mouth or changes in taste This list may not describe all possible side effects. Call your doctor for medical advice about side effects. You may report side effects to FDA at 1-800-FDA-1088. Where should I keep my medicine? This drug is given in a hospital or clinic and will not be stored at home. NOTE: This sheet is a summary. It may not cover all possible information. If you have questions about this medicine, talk to your doctor, pharmacist, or health care provider.  2018 Elsevier/Gold Standard (2008-02-25 14:40:54)   Etoposide, VP-16 injection What is this medicine? ETOPOSIDE, VP-16 (e toe POE side) is a chemotherapy drug. It is used to treat testicular cancer, lung cancer, and other cancers. This medicine may be used for other purposes; ask your health care provider or pharmacist if you have questions. COMMON BRAND NAME(S): Etopophos, Toposar, VePesid What should I tell my health care provider before I take this medicine? They need to know if you have any   of these conditions: -infection -kidney disease -liver disease -low blood counts, like low white cell, platelet, or red cell counts -an unusual or allergic reaction to etoposide, other medicines, foods, dyes, or preservatives -pregnant or trying to get pregnant -breast-feeding How should I use this  medicine? This medicine is for infusion into a vein. It is administered in a hospital or clinic by a specially trained health care professional. Talk to your pediatrician regarding the use of this medicine in children. Special care may be needed. Overdosage: If you think you have taken too much of this medicine contact a poison control center or emergency room at once. NOTE: This medicine is only for you. Do not share this medicine with others. What if I miss a dose? It is important not to miss your dose. Call your doctor or health care professional if you are unable to keep an appointment. What may interact with this medicine? -aspirin -certain medications for seizures like carbamazepine, phenobarbital, phenytoin, valproic acid -cyclosporine -levamisole -warfarin This list may not describe all possible interactions. Give your health care provider a list of all the medicines, herbs, non-prescription drugs, or dietary supplements you use. Also tell them if you smoke, drink alcohol, or use illegal drugs. Some items may interact with your medicine. What should I watch for while using this medicine? Visit your doctor for checks on your progress. This drug may make you feel generally unwell. This is not uncommon, as chemotherapy can affect healthy cells as well as cancer cells. Report any side effects. Continue your course of treatment even though you feel ill unless your doctor tells you to stop. In some cases, you may be given additional medicines to help with side effects. Follow all directions for their use. Call your doctor or health care professional for advice if you get a fever, chills or sore throat, or other symptoms of a cold or flu. Do not treat yourself. This drug decreases your body's ability to fight infections. Try to avoid being around people who are sick. This medicine may increase your risk to bruise or bleed. Call your doctor or health care professional if you notice any unusual  bleeding. Talk to your doctor about your risk of cancer. You may be more at risk for certain types of cancers if you take this medicine. Do not become pregnant while taking this medicine or for at least 6 months after stopping it. Women should inform their doctor if they wish to become pregnant or think they might be pregnant. Women of child-bearing potential will need to have a negative pregnancy test before starting this medicine. There is a potential for serious side effects to an unborn child. Talk to your health care professional or pharmacist for more information. Do not breast-feed an infant while taking this medicine. Men must use a latex condom during sexual contact with a woman while taking this medicine and for at least 4 months after stopping it. A latex condom is needed even if you have had a vasectomy. Contact your doctor right away if your partner becomes pregnant. Do not donate sperm while taking this medicine and for at least 4 months after you stop taking this medicine. Men should inform their doctors if they wish to father a child. This medicine may lower sperm counts. What side effects may I notice from receiving this medicine? Side effects that you should report to your doctor or health care professional as soon as possible: -allergic reactions like skin rash, itching or hives, swelling of   the face, lips, or tongue -low blood counts - this medicine may decrease the number of white blood cells, red blood cells and platelets. You may be at increased risk for infections and bleeding. -signs of infection - fever or chills, cough, sore throat, pain or difficulty passing urine -signs of decreased platelets or bleeding - bruising, pinpoint red spots on the skin, black, tarry stools, blood in the urine -signs of decreased red blood cells - unusually weak or tired, fainting spells, lightheadedness -breathing problems -changes in vision -mouth or throat sores or ulcers -pain, redness,  swelling or irritation at the injection site -pain, tingling, numbness in the hands or feet -redness, blistering, peeling or loosening of the skin, including inside the mouth -seizures -vomiting Side effects that usually do not require medical attention (report to your doctor or health care professional if they continue or are bothersome): -diarrhea -hair loss -loss of appetite -nausea -stomach pain This list may not describe all possible side effects. Call your doctor for medical advice about side effects. You may report side effects to FDA at 1-800-FDA-1088. Where should I keep my medicine? This drug is given in a hospital or clinic and will not be stored at home. NOTE: This sheet is a summary. It may not cover all possible information. If you have questions about this medicine, talk to your doctor, pharmacist, or health care provider.  2018 Elsevier/Gold Standard (2015-11-12 11:53:23)       

## 2018-05-21 NOTE — Progress Notes (Signed)
[facility] Progress Note   [name] Seith [id number] [birth year] 22 y.o.  Greg Peterson is managed by Dr. [name]  Actively treated with chemotherapy/immunotherapy: yes  Current Therapy: Bleomycin, cisplatin, etoposide  Last Treated: 05/20/2018 (cycle 1, day 1)  Assessment: Plan:    External hemorrhoid - Plan: hydrocortisone (ANUSOL-HC) 2.5 % rectal cream   External hemorrhoid: Patient was given a prescription for Anusol HC cream and was instructed to use Tucks pads after using toilet paper for cleaning after bowel movements.  The patient's father expresses understanding and agreement with this plan.  Please see After Visit Summary for patient specific instructions.  Future Appointments  Date Time Provider [place]  05/22/2018  8:30 AM [facility] E14 [facility] None  05/23/2018  7:30 AM [facility] PROCEDURE 1 [facility] None  05/24/2018  7:30 AM [facility] PROCEDURE 2 [facility] None  05/28/2018  9:15 AM [facility] LAB 6 [facility] None  05/28/2018  9:30 AM [facility] J32 DNS [facility] None  05/28/2018 10:15 AM Shadad, Mathis Dad, MD [facility] None  05/28/2018 11:00 AM [facility] B4 [facility] None  06/04/2018  2:15 PM [facility] LAB 2 [facility] None  06/04/2018  2:30 PM [facility] FLUSH NURSE [facility] None  06/04/2018  3:15 PM [facility] J32 DNS [facility] None  06/10/2018  7:45 AM [facility] LAB 1 [facility] None  06/10/2018  8:00 AM [facility] [place] FLUSH [facility] None  06/10/2018  8:30 AM Curcio, Kristin R, NP [facility] None  06/10/2018  9:00 AM [facility] INFUSION [facility] None  06/11/2018  8:30 AM [facility] INFUSION [facility] None  06/12/2018  9:00 AM [facility] INFUSION [facility] None  06/13/2018  8:30 AM [facility] INFUSION [facility] None  06/14/2018  8:30 AM [facility] INFUSION [facility] None  06/18/2018  2:45 PM [facility] INFUSION [facility] None  06/25/2018  2:45 PM [facility] INFUSION [facility] None    No orders of  the defined types were placed in this encounter.      Subjective:   Patient ID:  Greg Peterson is a 22 y.o. (DOB Oct 05, 1996) male.  Chief Complaint: No chief complaint on file.   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  He has since developed stage IIc with retroperitoneal adenopathy which was noted in May 2019.  He presents to infusion today for cycle 1, day 1 of bleomycin, cisplatin, and etoposide.  I was asked to see the patient today as he was having issues of rectal pain with episodic bright red blood per rectum with wiping after bowel movements.  He denies constipation or diarrhea.  Medications: I have reviewed the patient's current medications.  Allergies: No Known Allergies  Past Medical History:  Diagnosis Date  . Autism spectrum disorder     Past Surgical History:  Procedure Laterality Date  . IR FLUORO GUIDE PORT INSERTION RIGHT  05/07/2018  . IR US GUIDE VASC ACCESS RIGHT  05/07/2018  . ORCHIECTOMY Right 01/04/2018   Procedure: RIGHT RADICAL ORCHIECTOMY;  Surgeon: Irine Seal, MD;  Location: [facility];  Service: Urology;  Laterality: Right;    No family history on file.  Social History   Socioeconomic History  . Marital status: Single    Spouse name: Not on file  . Number of children: Not on file  . Years of education: Not on file  . Highest education level: Not on file  Occupational History  . Not on file  Social Needs  . Financial resource strain: Not on file  . Food insecurity:    Worry: Not on file    Inability: Not on file  . Transportation needs:    Medical: Not on file    Non-medical: Not on file  Tobacco Use  . Smoking status: Never Smoker  . Smokeless tobacco: Never Used  Substance and Sexual Activity  . Alcohol use: No    Frequency: Never  . Drug use: No  . Sexual activity: Not on file  Lifestyle  .  Physical activity:    Days per week: Not on file    Minutes per session: Not on file  . Stress: Not on file  Relationships  . Social connections:    Talks on phone: Not on file    Gets together: Not on file    Attends religious service: Not on file    Active member of club or organization: Not on file    Attends meetings of clubs or organizations: Not on file    Relationship status: Not on file  . Intimate partner violence:    Fear of current or ex partner: Not on file    Emotionally abused: Not on file    Physically abused: Not on file    Forced sexual activity: Not on file  Other Topics Concern  . Not on file  Social History Narrative  . Not on file    Past Medical History, Surgical history, Social history, and Family history were reviewed and updated as appropriate.   Please see review of systems for further details on the patient's review from today.   Review of Systems:  Review of Systems  Gastrointestinal: Positive for anal bleeding and rectal pain.    Objective:   Physical Exam:  There were no vitals taken for this visit. ECOG: 0  Physical Exam  Constitutional: No distress.  The patient is an adult male who appears to be in no acute distress.  The patient's father was present during his examination.  Genitourinary:  Genitourinary Comments: 1 x 1 cm thrombosed hemorrhoid noted on the right lateral anus.  Neurological: He is alert.  Skin: He is not diaphoretic.    Lab Review:     Component Value Date/Time   NA 137 05/20/2018 0825   K 4.1 05/20/2018 0825   CL 105 05/20/2018 0825   CO2 24 05/20/2018 0825   GLUCOSE 98 05/20/2018 0825   BUN 20 05/20/2018 0825   CREATININE 1.06 05/20/2018 0825   CALCIUM 9.4 05/20/2018 0825   PROT 7.1 05/20/2018 0825   ALBUMIN 4.3 05/20/2018 0825   AST 22 05/20/2018 0825   ALT 34 05/20/2018 0825   ALKPHOS 84 05/20/2018 0825   BILITOT 0.9 05/20/2018 0825   GFRNONAA >60 05/20/2018 0825   GFRAA >60 05/20/2018 0825         Component Value Date/Time   WBC 6.3 05/20/2018 0825   WBC 5.8 05/07/2018 0946   RBC 5.19 05/20/2018 0825   HGB 15.6 05/20/2018 0825   HCT 45.8 05/20/2018 0825   PLT 221 05/20/2018 0825   MCV 88.2 05/20/2018 0825   MCH 30.1 05/20/2018 0825   MCHC 34.1 05/20/2018 0825   RDW 12.9 05/20/2018 0825   LYMPHSABS 2.3 05/20/2018 0825   MONOABS 0.9 05/20/2018 0825   EOSABS 0.3 05/20/2018 0825   BASOSABS 0.0 05/20/2018 0825   -------------------------------  Imaging from last 24 hours (if applicable):  Radiology interpretation: Ir US Guide Vasc Access Right  Result Date:  05/07/2018 CLINICAL DATA:  Testicular cancer EXAM: TUNNEL POWER PORT PLACEMENT WITH SUBCUTANEOUS POCKET UTILIZING ULTRASOUND & FLOUROSCOPY FLUOROSCOPY TIME:  12 seconds.  Two mGy. MEDICATIONS AND MEDICAL HISTORY: Versed 4 mg, Fentanyl 100 mcg. Additional Medications: Ancef 2 g. Antibiotics were given within 2 hours of the procedure. ANESTHESIA/SEDATION: Moderate sedation time: 29 minutes. Nursing monitored the the patient during the procedure. PROCEDURE: After written informed consent was obtained, patient was placed in the supine position on angiographic table. The right neck and chest was prepped and draped in a sterile fashion. Lidocaine was utilized for local anesthesia. The right jugular vein was noted to be patent initially with ultrasound. Under sonographic guidance, a micropuncture needle was inserted into the right IJ vein (Ultrasound and fluoroscopic image documentation was performed). The needle was removed over an 018 wire which was exchanged for a Amplatz. This was advanced into the IVC. An 8-French dilator was advanced over the Amplatz. A small incision was made in the right upper chest over the anterior right second rib. Utilizing blunt dissection, a subcutaneous pocket was created in the caudal direction. The pocket was irrigated with a copious amount of sterile normal saline. The port catheter was tunneled from the chest  incision, and out the neck incision. The reservoir was inserted into the subcutaneous pocket and secured with two 3-0 Ethilon stitches. A peel-away sheath was advanced over the Amplatz wire. The port catheter was cut to measure length and inserted through the peel-away sheath. The peel-away sheath was removed. The chest incision was closed with 3-0 Vicryl interrupted stitches for the subcutaneous tissue and a running of 4-0 Vicryl subcuticular stitch for the skin. The neck incision was closed with a 4-0 Vicryl subcuticular stitch. Derma-bond was applied to both surgical incisions. The port reservoir was flushed and instilled with heparinized saline. No complications. FINDINGS: A right IJ vein Port-A-Cath is in place with its tip at the cavoatrial junction. COMPLICATIONS: None IMPRESSION: Successful 8 French right internal jugular vein power port placement with its tip at the SVC/RA junction. Electronically Signed   By: Marybelle Killings M.D.   On: 05/07/2018 12:49   Ir Fluoro Guide Port Insertion Right  Result Date: 05/07/2018 CLINICAL DATA:  Testicular cancer EXAM: TUNNEL POWER PORT PLACEMENT WITH SUBCUTANEOUS POCKET UTILIZING ULTRASOUND & FLOUROSCOPY FLUOROSCOPY TIME:  12 seconds.  Two mGy. MEDICATIONS AND MEDICAL HISTORY: Versed 4 mg, Fentanyl 100 mcg. Additional Medications: Ancef 2 g. Antibiotics were given within 2 hours of the procedure. ANESTHESIA/SEDATION: Moderate sedation time: 29 minutes. Nursing monitored the the patient during the procedure. PROCEDURE: After written informed consent was obtained, patient was placed in the supine position on angiographic table. The right neck and chest was prepped and draped in a sterile fashion. Lidocaine was utilized for local anesthesia. The right jugular vein was noted to be patent initially with ultrasound. Under sonographic guidance, a micropuncture needle was inserted into the right IJ vein (Ultrasound and fluoroscopic image documentation was performed). The needle  was removed over an 018 wire which was exchanged for a Amplatz. This was advanced into the IVC. An 8-French dilator was advanced over the Amplatz. A small incision was made in the right upper chest over the anterior right second rib. Utilizing blunt dissection, a subcutaneous pocket was created in the caudal direction. The pocket was irrigated with a copious amount of sterile normal saline. The port catheter was tunneled from the chest incision, and out the neck incision. The reservoir was inserted into the subcutaneous pocket and secured with  two 3-0 Ethilon stitches. A peel-away sheath was advanced over the Amplatz wire. The port catheter was cut to measure length and inserted through the peel-away sheath. The peel-away sheath was removed. The chest incision was closed with 3-0 Vicryl interrupted stitches for the subcutaneous tissue and a running of 4-0 Vicryl subcuticular stitch for the skin. The neck incision was closed with a 4-0 Vicryl subcuticular stitch. Derma-bond was applied to both surgical incisions. The port reservoir was flushed and instilled with heparinized saline. No complications. FINDINGS: A right IJ vein Port-A-Cath is in place with its tip at the cavoatrial junction. COMPLICATIONS: None IMPRESSION: Successful 8 French right internal jugular vein power port placement with its tip at the SVC/RA junction. Electronically Signed   By: Marybelle Killings M.D.   On: 05/07/2018 12:49

## 2018-05-22 ENCOUNTER — Inpatient Hospital Stay: Payer: BLUE CROSS/BLUE SHIELD

## 2018-05-22 VITALS — BP 130/74 | HR 78 | Temp 98.6°F | Resp 18

## 2018-05-22 DIAGNOSIS — C6291 Malignant neoplasm of right testis, unspecified whether descended or undescended: Secondary | ICD-10-CM | POA: Diagnosis not present

## 2018-05-22 DIAGNOSIS — C6290 Malignant neoplasm of unspecified testis, unspecified whether descended or undescended: Secondary | ICD-10-CM

## 2018-05-22 MED ORDER — PALONOSETRON HCL INJECTION 0.25 MG/5ML
INTRAVENOUS | Status: AC
  Filled 2018-05-22: qty 5

## 2018-05-22 MED ORDER — DEXAMETHASONE SODIUM PHOSPHATE 10 MG/ML IJ SOLN
INTRAMUSCULAR | Status: AC
  Filled 2018-05-22: qty 1

## 2018-05-22 MED ORDER — POTASSIUM CHLORIDE 2 MEQ/ML IV SOLN
Freq: Once | INTRAVENOUS | Status: AC
  Administered 2018-05-22: 09:00:00 via INTRAVENOUS
  Filled 2018-05-22: qty 10

## 2018-05-22 MED ORDER — SODIUM CHLORIDE 0.9% FLUSH
10.0000 mL | INTRAVENOUS | Status: DC | PRN
  Administered 2018-05-22: 10 mL
  Filled 2018-05-22: qty 10

## 2018-05-22 MED ORDER — SODIUM CHLORIDE 0.9 % IV SOLN
Freq: Once | INTRAVENOUS | Status: AC
  Administered 2018-05-22: 09:00:00 via INTRAVENOUS

## 2018-05-22 MED ORDER — SODIUM CHLORIDE 0.9 % IV SOLN
20.0000 mg/m2 | Freq: Once | INTRAVENOUS | Status: AC
  Administered 2018-05-22: 41 mg via INTRAVENOUS
  Filled 2018-05-22: qty 41

## 2018-05-22 MED ORDER — PALONOSETRON HCL INJECTION 0.25 MG/5ML
0.2500 mg | Freq: Once | INTRAVENOUS | Status: AC
Start: 2018-05-22 — End: 2018-05-22
  Administered 2018-05-22: 0.25 mg via INTRAVENOUS

## 2018-05-22 MED ORDER — FOSAPREPITANT DIMEGLUMINE INJECTION 150 MG
Freq: Once | INTRAVENOUS | Status: AC
  Administered 2018-05-22: 11:00:00 via INTRAVENOUS
  Filled 2018-05-22: qty 5

## 2018-05-22 MED ORDER — SODIUM CHLORIDE 0.9 % IV SOLN
98.0000 mg/m2 | Freq: Once | INTRAVENOUS | Status: AC
  Administered 2018-05-22: 200 mg via INTRAVENOUS
  Filled 2018-05-22: qty 10

## 2018-05-22 MED ORDER — HEPARIN SOD (PORK) LOCK FLUSH 100 UNIT/ML IV SOLN
500.0000 [IU] | Freq: Once | INTRAVENOUS | Status: AC | PRN
  Administered 2018-05-22: 500 [IU]
  Filled 2018-05-22: qty 5

## 2018-05-22 NOTE — Patient Instructions (Signed)
[facility] Discharge Instructions for Patients Receiving Chemotherapy  Today you received the following chemotherapy agents: Cisplatin and Etoposide.  To help prevent nausea and vomiting after your treatment, we encourage you to take your nausea medication as prescribed.   If you develop nausea and vomiting that is not controlled by your nausea medication, call the clinic.   BELOW ARE SYMPTOMS THAT SHOULD BE REPORTED IMMEDIATELY:  *FEVER GREATER THAN 100.5 F  *CHILLS WITH OR WITHOUT FEVER  NAUSEA AND VOMITING THAT IS NOT CONTROLLED WITH YOUR NAUSEA MEDICATION  *UNUSUAL SHORTNESS OF BREATH  *UNUSUAL BRUISING OR BLEEDING  TENDERNESS IN MOUTH AND THROAT WITH OR WITHOUT PRESENCE OF ULCERS  *URINARY PROBLEMS  *BOWEL PROBLEMS  UNUSUAL RASH Items with * indicate a potential emergency and should be followed up as soon as possible.  Feel free to call the clinic should you have any questions or concerns. The clinic phone number is [phone].  Please show the [facility] at check-in to the Emergency Department and triage nurse.   Cisplatin injection What is this medicine? CISPLATIN (SIS pla tin) is a chemotherapy drug. It targets fast dividing cells, like cancer cells, and causes these cells to die. This medicine is used to treat many types of cancer like bladder, ovarian, and testicular cancers. This medicine may be used for other purposes; ask your health care provider or pharmacist if you have questions. COMMON BRAND NAME(S): Platinol, Platinol -AQ What should I tell my health care provider before I take this medicine? They need to know if you have any of these conditions: -blood disorders -hearing problems -kidney disease -recent or ongoing radiation therapy -an unusual or allergic reaction to cisplatin, carboplatin, other chemotherapy, other medicines, foods, dyes, or preservatives -pregnant or trying to get pregnant -breast-feeding How should I use  this medicine? This drug is given as an infusion into a vein. It is administered in a hospital or clinic by a specially trained health care professional. Talk to your pediatrician regarding the use of this medicine in children. Special care may be needed. Overdosage: If you think you have taken too much of this medicine contact a poison control center or emergency room at once. NOTE: This medicine is only for you. Do not share this medicine with others. What if I miss a dose? It is important not to miss a dose. Call your doctor or health care professional if you are unable to keep an appointment. What may interact with this medicine? -dofetilide -foscarnet -medicines for seizures -medicines to increase blood counts like filgrastim, pegfilgrastim, sargramostim -probenecid -pyridoxine used with altretamine -rituximab -some antibiotics like amikacin, gentamicin, neomycin, polymyxin B, streptomycin, tobramycin -sulfinpyrazone -vaccines -zalcitabine Talk to your doctor or health care professional before taking any of these medicines: -acetaminophen -aspirin -ibuprofen -ketoprofen -naproxen This list may not describe all possible interactions. Give your health care provider a list of all the medicines, herbs, non-prescription drugs, or dietary supplements you use. Also tell them if you smoke, drink alcohol, or use illegal drugs. Some items may interact with your medicine. What should I watch for while using this medicine? Your condition will be monitored carefully while you are receiving this medicine. You will need important blood work done while you are taking this medicine. This drug may make you feel generally unwell. This is not uncommon, as chemotherapy can affect healthy cells as well as cancer cells. Report any side effects. Continue your course of treatment even though you feel ill unless your doctor tells  you to stop. In some cases, you may be given additional medicines to help with  side effects. Follow all directions for their use. Call your doctor or health care professional for advice if you get a fever, chills or sore throat, or other symptoms of a cold or flu. Do not treat yourself. This drug decreases your body's ability to fight infections. Try to avoid being around people who are sick. This medicine may increase your risk to bruise or bleed. Call your doctor or health care professional if you notice any unusual bleeding. Be careful brushing and flossing your teeth or using a toothpick because you may get an infection or bleed more easily. If you have any dental work done, tell your dentist you are receiving this medicine. Avoid taking products that contain aspirin, acetaminophen, ibuprofen, naproxen, or ketoprofen unless instructed by your doctor. These medicines may hide a fever. Do not become pregnant while taking this medicine. Women should inform their doctor if they wish to become pregnant or think they might be pregnant. There is a potential for serious side effects to an unborn child. Talk to your health care professional or pharmacist for more information. Do not breast-feed an infant while taking this medicine. Drink fluids as directed while you are taking this medicine. This will help protect your kidneys. Call your doctor or health care professional if you get diarrhea. Do not treat yourself. What side effects may I notice from receiving this medicine? Side effects that you should report to your doctor or health care professional as soon as possible: -allergic reactions like skin rash, itching or hives, swelling of the face, lips, or tongue -signs of infection - fever or chills, cough, sore throat, pain or difficulty passing urine -signs of decreased platelets or bleeding - bruising, pinpoint red spots on the skin, black, tarry stools, nosebleeds -signs of decreased red blood cells - unusually weak or tired, fainting spells, lightheadedness -breathing  problems -changes in hearing -gout pain -low blood counts - This drug may decrease the number of white blood cells, red blood cells and platelets. You may be at increased risk for infections and bleeding. -nausea and vomiting -pain, swelling, redness or irritation at the injection site -pain, tingling, numbness in the hands or feet -problems with balance, movement -trouble passing urine or change in the amount of urine Side effects that usually do not require medical attention (report to your doctor or health care professional if they continue or are bothersome): -changes in vision -loss of appetite -metallic taste in the mouth or changes in taste This list may not describe all possible side effects. Call your doctor for medical advice about side effects. You may report side effects to FDA at 1-800-FDA-1088. Where should I keep my medicine? This drug is given in a hospital or clinic and will not be stored at home. NOTE: This sheet is a summary. It may not cover all possible information. If you have questions about this medicine, talk to your doctor, pharmacist, or health care provider.  2018 Elsevier/Gold Standard (2008-02-25 14:40:54)   Etoposide, VP-16 injection What is this medicine? ETOPOSIDE, VP-16 (e toe POE side) is a chemotherapy drug. It is used to treat testicular cancer, lung cancer, and other cancers. This medicine may be used for other purposes; ask your health care provider or pharmacist if you have questions. COMMON BRAND NAME(S): Etopophos, Toposar, VePesid What should I tell my health care provider before I take this medicine? They need to know if you have any  of these conditions: -infection -kidney disease -liver disease -low blood counts, like low white cell, platelet, or red cell counts -an unusual or allergic reaction to etoposide, other medicines, foods, dyes, or preservatives -pregnant or trying to get pregnant -breast-feeding How should I use this  medicine? This medicine is for infusion into a vein. It is administered in a hospital or clinic by a specially trained health care professional. Talk to your pediatrician regarding the use of this medicine in children. Special care may be needed. Overdosage: If you think you have taken too much of this medicine contact a poison control center or emergency room at once. NOTE: This medicine is only for you. Do not share this medicine with others. What if I miss a dose? It is important not to miss your dose. Call your doctor or health care professional if you are unable to keep an appointment. What may interact with this medicine? -aspirin -certain medications for seizures like carbamazepine, phenobarbital, phenytoin, valproic acid -cyclosporine -levamisole -warfarin This list may not describe all possible interactions. Give your health care provider a list of all the medicines, herbs, non-prescription drugs, or dietary supplements you use. Also tell them if you smoke, drink alcohol, or use illegal drugs. Some items may interact with your medicine. What should I watch for while using this medicine? Visit your doctor for checks on your progress. This drug may make you feel generally unwell. This is not uncommon, as chemotherapy can affect healthy cells as well as cancer cells. Report any side effects. Continue your course of treatment even though you feel ill unless your doctor tells you to stop. In some cases, you may be given additional medicines to help with side effects. Follow all directions for their use. Call your doctor or health care professional for advice if you get a fever, chills or sore throat, or other symptoms of a cold or flu. Do not treat yourself. This drug decreases your body's ability to fight infections. Try to avoid being around people who are sick. This medicine may increase your risk to bruise or bleed. Call your doctor or health care professional if you notice any unusual  bleeding. Talk to your doctor about your risk of cancer. You may be more at risk for certain types of cancers if you take this medicine. Do not become pregnant while taking this medicine or for at least 6 months after stopping it. Women should inform their doctor if they wish to become pregnant or think they might be pregnant. Women of child-bearing potential will need to have a negative pregnancy test before starting this medicine. There is a potential for serious side effects to an unborn child. Talk to your health care professional or pharmacist for more information. Do not breast-feed an infant while taking this medicine. Men must use a latex condom during sexual contact with a woman while taking this medicine and for at least 4 months after stopping it. A latex condom is needed even if you have had a vasectomy. Contact your doctor right away if your partner becomes pregnant. Do not donate sperm while taking this medicine and for at least 4 months after you stop taking this medicine. Men should inform their doctors if they wish to father a child. This medicine may lower sperm counts. What side effects may I notice from receiving this medicine? Side effects that you should report to your doctor or health care professional as soon as possible: -allergic reactions like skin rash, itching or hives, swelling of  the face, lips, or tongue -low blood counts - this medicine may decrease the number of white blood cells, red blood cells and platelets. You may be at increased risk for infections and bleeding. -signs of infection - fever or chills, cough, sore throat, pain or difficulty passing urine -signs of decreased platelets or bleeding - bruising, pinpoint red spots on the skin, black, tarry stools, blood in the urine -signs of decreased red blood cells - unusually weak or tired, fainting spells, lightheadedness -breathing problems -changes in vision -mouth or throat sores or ulcers -pain, redness,  swelling or irritation at the injection site -pain, tingling, numbness in the hands or feet -redness, blistering, peeling or loosening of the skin, including inside the mouth -seizures -vomiting Side effects that usually do not require medical attention (report to your doctor or health care professional if they continue or are bothersome): -diarrhea -hair loss -loss of appetite -nausea -stomach pain This list may not describe all possible side effects. Call your doctor for medical advice about side effects. You may report side effects to FDA at 1-800-FDA-1088. Where should I keep my medicine? This drug is given in a hospital or clinic and will not be stored at home. NOTE: This sheet is a summary. It may not cover all possible information. If you have questions about this medicine, talk to your doctor, pharmacist, or health care provider.  2018 [employer]vier/Gold Standard (2015-11-12 11:53:23)

## 2018-05-23 ENCOUNTER — Inpatient Hospital Stay: Payer: BLUE CROSS/BLUE SHIELD

## 2018-05-23 VITALS — BP 123/75 | HR 83 | Temp 98.1°F | Resp 18

## 2018-05-23 DIAGNOSIS — C6291 Malignant neoplasm of right testis, unspecified whether descended or undescended: Secondary | ICD-10-CM | POA: Diagnosis not present

## 2018-05-23 DIAGNOSIS — C6290 Malignant neoplasm of unspecified testis, unspecified whether descended or undescended: Secondary | ICD-10-CM

## 2018-05-23 MED ORDER — POTASSIUM CHLORIDE 2 MEQ/ML IV SOLN
Freq: Once | INTRAVENOUS | Status: AC
  Administered 2018-05-23: 08:00:00 via INTRAVENOUS
  Filled 2018-05-23: qty 10

## 2018-05-23 MED ORDER — SODIUM CHLORIDE 0.9 % IV SOLN
Freq: Once | INTRAVENOUS | Status: AC
  Administered 2018-05-23: 08:00:00 via INTRAVENOUS

## 2018-05-23 MED ORDER — SODIUM CHLORIDE 0.9 % IV SOLN
20.0000 mg/m2 | Freq: Once | INTRAVENOUS | Status: AC
  Administered 2018-05-23: 41 mg via INTRAVENOUS
  Filled 2018-05-23: qty 41

## 2018-05-23 MED ORDER — DEXAMETHASONE SODIUM PHOSPHATE 10 MG/ML IJ SOLN
10.0000 mg | Freq: Once | INTRAMUSCULAR | Status: AC
  Administered 2018-05-23: 10 mg via INTRAVENOUS

## 2018-05-23 MED ORDER — SODIUM CHLORIDE 0.9 % IV SOLN
98.0000 mg/m2 | Freq: Once | INTRAVENOUS | Status: AC
  Administered 2018-05-23: 200 mg via INTRAVENOUS
  Filled 2018-05-23: qty 10

## 2018-05-23 MED ORDER — DEXAMETHASONE SODIUM PHOSPHATE 10 MG/ML IJ SOLN
INTRAMUSCULAR | Status: AC
  Filled 2018-05-23: qty 1

## 2018-05-23 NOTE — Patient Instructions (Signed)
[facility] Discharge Instructions for Patients Receiving Chemotherapy  Today you received the following chemotherapy agents: Cisplatin and Etoposide.  To help prevent nausea and vomiting after your treatment, we encourage you to take your nausea medication as prescribed.   If you develop nausea and vomiting that is not controlled by your nausea medication, call the clinic.   BELOW ARE SYMPTOMS THAT SHOULD BE REPORTED IMMEDIATELY:  *FEVER GREATER THAN 100.5 F  *CHILLS WITH OR WITHOUT FEVER  NAUSEA AND VOMITING THAT IS NOT CONTROLLED WITH YOUR NAUSEA MEDICATION  *UNUSUAL SHORTNESS OF BREATH  *UNUSUAL BRUISING OR BLEEDING  TENDERNESS IN MOUTH AND THROAT WITH OR WITHOUT PRESENCE OF ULCERS  *URINARY PROBLEMS  *BOWEL PROBLEMS  UNUSUAL RASH Items with * indicate a potential emergency and should be followed up as soon as possible.  Feel free to call the clinic should you have any questions or concerns. The clinic phone number is [phone].  Please show the [facility] at check-in to the Emergency Department and triage nurse.

## 2018-05-24 ENCOUNTER — Telehealth: Payer: Self-pay | Admitting: *Deleted

## 2018-05-24 ENCOUNTER — Inpatient Hospital Stay: Payer: BLUE CROSS/BLUE SHIELD

## 2018-05-24 VITALS — BP 128/77 | HR 82 | Temp 98.0°F | Resp 16

## 2018-05-24 DIAGNOSIS — C6290 Malignant neoplasm of unspecified testis, unspecified whether descended or undescended: Secondary | ICD-10-CM

## 2018-05-24 DIAGNOSIS — C6291 Malignant neoplasm of right testis, unspecified whether descended or undescended: Secondary | ICD-10-CM | POA: Diagnosis not present

## 2018-05-24 MED ORDER — SODIUM CHLORIDE 0.9 % IV SOLN
Freq: Once | INTRAVENOUS | Status: AC
  Administered 2018-05-24: 08:00:00 via INTRAVENOUS

## 2018-05-24 MED ORDER — HEPARIN SOD (PORK) LOCK FLUSH 100 UNIT/ML IV SOLN
500.0000 [IU] | Freq: Once | INTRAVENOUS | Status: AC | PRN
  Administered 2018-05-24: 500 [IU]
  Filled 2018-05-24: qty 5

## 2018-05-24 MED ORDER — SODIUM CHLORIDE 0.9 % IV SOLN
20.0000 mg/m2 | Freq: Once | INTRAVENOUS | Status: AC
  Administered 2018-05-24: 41 mg via INTRAVENOUS
  Filled 2018-05-24: qty 41

## 2018-05-24 MED ORDER — PALONOSETRON HCL INJECTION 0.25 MG/5ML
0.2500 mg | Freq: Once | INTRAVENOUS | Status: AC
  Administered 2018-05-24: 0.25 mg via INTRAVENOUS

## 2018-05-24 MED ORDER — SODIUM CHLORIDE 0.9 % IV SOLN
100.0000 mg/m2 | Freq: Once | INTRAVENOUS | Status: DC
  Filled 2018-05-24: qty 10.5

## 2018-05-24 MED ORDER — DEXTROSE-NACL 5-0.45 % IV SOLN
Freq: Once | INTRAVENOUS | Status: AC
  Administered 2018-05-24: 08:00:00 via INTRAVENOUS
  Filled 2018-05-24: qty 10

## 2018-05-24 MED ORDER — PALONOSETRON HCL INJECTION 0.25 MG/5ML
INTRAVENOUS | Status: AC
Start: 2018-05-24 — End: ?
  Filled 2018-05-24: qty 5

## 2018-05-24 MED ORDER — SODIUM CHLORIDE 0.9 % IV SOLN
Freq: Once | INTRAVENOUS | Status: AC
  Administered 2018-05-24: 10:00:00 via INTRAVENOUS
  Filled 2018-05-24: qty 5

## 2018-05-24 MED ORDER — SODIUM CHLORIDE 0.9 % IV SOLN
200.0000 mg | Freq: Once | INTRAVENOUS | Status: AC
  Administered 2018-05-24: 200 mg via INTRAVENOUS
  Filled 2018-05-24: qty 10

## 2018-05-24 MED ORDER — SODIUM CHLORIDE 0.9% FLUSH
10.0000 mL | INTRAVENOUS | Status: DC | PRN
  Administered 2018-05-24: 10 mL
  Filled 2018-05-24: qty 10

## 2018-05-24 NOTE — Telephone Encounter (Signed)
-----   Message from Levada Schilling, RN sent at 05/20/2018  2:12 PM EDT ----- Regarding: Dr. [name] 1st tx f/u call 1st tx f/u call. Thanks!

## 2018-05-24 NOTE — Patient Instructions (Signed)
[facility] Discharge Instructions for Patients Receiving Chemotherapy  Today you received the following chemotherapy agents: Cisplatin and Etoposide.  To help prevent nausea and vomiting after your treatment, we encourage you to take your nausea medication as prescribed.   If you develop nausea and vomiting that is not controlled by your nausea medication, call the clinic.   BELOW ARE SYMPTOMS THAT SHOULD BE REPORTED IMMEDIATELY:  *FEVER GREATER THAN 100.5 F  *CHILLS WITH OR WITHOUT FEVER  NAUSEA AND VOMITING THAT IS NOT CONTROLLED WITH YOUR NAUSEA MEDICATION  *UNUSUAL SHORTNESS OF BREATH  *UNUSUAL BRUISING OR BLEEDING  TENDERNESS IN MOUTH AND THROAT WITH OR WITHOUT PRESENCE OF ULCERS  *URINARY PROBLEMS  *BOWEL PROBLEMS  UNUSUAL RASH Items with * indicate a potential emergency and should be followed up as soon as possible.  Feel free to call the clinic should you have any questions or concerns. The clinic phone number is [phone].  Please show the Cross at check-in to the Emergency Department and triage nurse.   Cisplatin injection What is this medicine? CISPLATIN (SIS pla tin) is a chemotherapy drug. It targets fast dividing cells, like cancer cells, and causes these cells to die. This medicine is used to treat many types of cancer like bladder, ovarian, and testicular cancers. This medicine may be used for other purposes; ask your health care provider or pharmacist if you have questions. COMMON BRAND NAME(S): Platinol, Platinol -AQ What should I tell my health care provider before I take this medicine? They need to know if you have any of these conditions: -blood disorders -hearing problems -kidney disease -recent or ongoing radiation therapy -an unusual or allergic reaction to cisplatin, carboplatin, other chemotherapy, other medicines, foods, dyes, or preservatives -pregnant or trying to get pregnant -breast-feeding How should I use  this medicine? This drug is given as an infusion into a vein. It is administered in a hospital or clinic by a specially trained health care professional. Talk to your pediatrician regarding the use of this medicine in children. Special care may be needed. Overdosage: If you think you have taken too much of this medicine contact a poison control center or emergency room at once. NOTE: This medicine is only for you. Do not share this medicine with others. What if I miss a dose? It is important not to miss a dose. Call your doctor or health care professional if you are unable to keep an appointment. What may interact with this medicine? -dofetilide -foscarnet -medicines for seizures -medicines to increase blood counts like filgrastim, pegfilgrastim, sargramostim -probenecid -pyridoxine used with altretamine -rituximab -some antibiotics like amikacin, gentamicin, neomycin, polymyxin B, streptomycin, tobramycin -sulfinpyrazone -vaccines -zalcitabine Talk to your doctor or health care professional before taking any of these medicines: -acetaminophen -aspirin -ibuprofen -ketoprofen -naproxen This list may not describe all possible interactions. Give your health care provider a list of all the medicines, herbs, non-prescription drugs, or dietary supplements you use. Also tell them if you smoke, drink alcohol, or use illegal drugs. Some items may interact with your medicine. What should I watch for while using this medicine? Your condition will be monitored carefully while you are receiving this medicine. You will need important blood work done while you are taking this medicine. This drug may make you feel generally unwell. This is not uncommon, as chemotherapy can affect healthy cells as well as cancer cells. Report any side effects. Continue your course of treatment even though you feel ill unless your doctor tells  you to stop. In some cases, you may be given additional medicines to help with  side effects. Follow all directions for their use. Call your doctor or health care professional for advice if you get a fever, chills or sore throat, or other symptoms of a cold or flu. Do not treat yourself. This drug decreases your body's ability to fight infections. Try to avoid being around people who are sick. This medicine may increase your risk to bruise or bleed. Call your doctor or health care professional if you notice any unusual bleeding. Be careful brushing and flossing your teeth or using a toothpick because you may get an infection or bleed more easily. If you have any dental work done, tell your dentist you are receiving this medicine. Avoid taking products that contain aspirin, acetaminophen, ibuprofen, naproxen, or ketoprofen unless instructed by your doctor. These medicines may hide a fever. Do not become pregnant while taking this medicine. Women should inform their doctor if they wish to become pregnant or think they might be pregnant. There is a potential for serious side effects to an unborn child. Talk to your health care professional or pharmacist for more information. Do not breast-feed an infant while taking this medicine. Drink fluids as directed while you are taking this medicine. This will help protect your kidneys. Call your doctor or health care professional if you get diarrhea. Do not treat yourself. What side effects may I notice from receiving this medicine? Side effects that you should report to your doctor or health care professional as soon as possible: -allergic reactions like skin rash, itching or hives, swelling of the face, lips, or tongue -signs of infection - fever or chills, cough, sore throat, pain or difficulty passing urine -signs of decreased platelets or bleeding - bruising, pinpoint red spots on the skin, black, tarry stools, nosebleeds -signs of decreased red blood cells - unusually weak or tired, fainting spells, lightheadedness -breathing  problems -changes in hearing -gout pain -low blood counts - This drug may decrease the number of white blood cells, red blood cells and platelets. You may be at increased risk for infections and bleeding. -nausea and vomiting -pain, swelling, redness or irritation at the injection site -pain, tingling, numbness in the hands or feet -problems with balance, movement -trouble passing urine or change in the amount of urine Side effects that usually do not require medical attention (report to your doctor or health care professional if they continue or are bothersome): -changes in vision -loss of appetite -metallic taste in the mouth or changes in taste This list may not describe all possible side effects. Call your doctor for medical advice about side effects. You may report side effects to FDA at 1-800-FDA-1088. Where should I keep my medicine? This drug is given in a hospital or clinic and will not be stored at home. NOTE: This sheet is a summary. It may not cover all possible information. If you have questions about this medicine, talk to your doctor, pharmacist, or health care provider.  2018 Elsevier/Gold Standard (2008-02-25 14:40:54)   Etoposide, VP-16 injection What is this medicine? ETOPOSIDE, VP-16 (e toe POE side) is a chemotherapy drug. It is used to treat testicular cancer, lung cancer, and other cancers. This medicine may be used for other purposes; ask your health care provider or pharmacist if you have questions. COMMON BRAND NAME(S): Etopophos, Toposar, VePesid What should I tell my health care provider before I take this medicine? They need to know if you have any  of these conditions: -infection -kidney disease -liver disease -low blood counts, like low white cell, platelet, or red cell counts -an unusual or allergic reaction to etoposide, other medicines, foods, dyes, or preservatives -pregnant or trying to get pregnant -breast-feeding How should I use this  medicine? This medicine is for infusion into a vein. It is administered in a hospital or clinic by a specially trained health care professional. Talk to your pediatrician regarding the use of this medicine in children. Special care may be needed. Overdosage: If you think you have taken too much of this medicine contact a poison control center or emergency room at once. NOTE: This medicine is only for you. Do not share this medicine with others. What if I miss a dose? It is important not to miss your dose. Call your doctor or health care professional if you are unable to keep an appointment. What may interact with this medicine? -aspirin -certain medications for seizures like carbamazepine, phenobarbital, phenytoin, valproic acid -cyclosporine -levamisole -warfarin This list may not describe all possible interactions. Give your health care provider a list of all the medicines, herbs, non-prescription drugs, or dietary supplements you use. Also tell them if you smoke, drink alcohol, or use illegal drugs. Some items may interact with your medicine. What should I watch for while using this medicine? Visit your doctor for checks on your progress. This drug may make you feel generally unwell. This is not uncommon, as chemotherapy can affect healthy cells as well as cancer cells. Report any side effects. Continue your course of treatment even though you feel ill unless your doctor tells you to stop. In some cases, you may be given additional medicines to help with side effects. Follow all directions for their use. Call your doctor or health care professional for advice if you get a fever, chills or sore throat, or other symptoms of a cold or flu. Do not treat yourself. This drug decreases your body's ability to fight infections. Try to avoid being around people who are sick. This medicine may increase your risk to bruise or bleed. Call your doctor or health care professional if you notice any unusual  bleeding. Talk to your doctor about your risk of cancer. You may be more at risk for certain types of cancers if you take this medicine. Do not become pregnant while taking this medicine or for at least 6 months after stopping it. Women should inform their doctor if they wish to become pregnant or think they might be pregnant. Women of child-bearing potential will need to have a negative pregnancy test before starting this medicine. There is a potential for serious side effects to an unborn child. Talk to your health care professional or pharmacist for more information. Do not breast-feed an infant while taking this medicine. Men must use a latex condom during sexual contact with a woman while taking this medicine and for at least 4 months after stopping it. A latex condom is needed even if you have had a vasectomy. Contact your doctor right away if your partner becomes pregnant. Do not donate sperm while taking this medicine and for at least 4 months after you stop taking this medicine. Men should inform their doctors if they wish to father a child. This medicine may lower sperm counts. What side effects may I notice from receiving this medicine? Side effects that you should report to your doctor or health care professional as soon as possible: -allergic reactions like skin rash, itching or hives, swelling of  the face, lips, or tongue -low blood counts - this medicine may decrease the number of white blood cells, red blood cells and platelets. You may be at increased risk for infections and bleeding. -signs of infection - fever or chills, cough, sore throat, pain or difficulty passing urine -signs of decreased platelets or bleeding - bruising, pinpoint red spots on the skin, black, tarry stools, blood in the urine -signs of decreased red blood cells - unusually weak or tired, fainting spells, lightheadedness -breathing problems -changes in vision -mouth or throat sores or ulcers -pain, redness,  swelling or irritation at the injection site -pain, tingling, numbness in the hands or feet -redness, blistering, peeling or loosening of the skin, including inside the mouth -seizures -vomiting Side effects that usually do not require medical attention (report to your doctor or health care professional if they continue or are bothersome): -diarrhea -hair loss -loss of appetite -nausea -stomach pain This list may not describe all possible side effects. Call your doctor for medical advice about side effects. You may report side effects to FDA at 1-800-FDA-1088. Where should I keep my medicine? This drug is given in a hospital or clinic and will not be stored at home. NOTE: This sheet is a summary. It may not cover all possible information. If you have questions about this medicine, talk to your doctor, pharmacist, or health care provider.  2018 Elsevier/Gold Standard (2015-11-12 11:53:23)

## 2018-05-24 NOTE — Telephone Encounter (Signed)
Attempted to make a chemo f/u call. LM on VM to call me.

## 2018-05-28 ENCOUNTER — Inpatient Hospital Stay: Payer: BLUE CROSS/BLUE SHIELD | Admitting: Oncology

## 2018-05-28 ENCOUNTER — Inpatient Hospital Stay: Payer: BLUE CROSS/BLUE SHIELD

## 2018-05-28 ENCOUNTER — Telehealth: Payer: Self-pay | Admitting: Oncology

## 2018-05-28 VITALS — BP 119/84 | HR 85 | Temp 97.9°F | Resp 18 | Ht 71.0 in | Wt 184.9 lb

## 2018-05-28 DIAGNOSIS — Z95828 Presence of other vascular implants and grafts: Secondary | ICD-10-CM

## 2018-05-28 DIAGNOSIS — C6290 Malignant neoplasm of unspecified testis, unspecified whether descended or undescended: Secondary | ICD-10-CM

## 2018-05-28 DIAGNOSIS — K649 Unspecified hemorrhoids: Secondary | ICD-10-CM

## 2018-05-28 DIAGNOSIS — R1013 Epigastric pain: Secondary | ICD-10-CM

## 2018-05-28 DIAGNOSIS — C6291 Malignant neoplasm of right testis, unspecified whether descended or undescended: Secondary | ICD-10-CM | POA: Diagnosis not present

## 2018-05-28 LAB — CMP (CANCER CENTER ONLY)
ALT: 31 U/L (ref 0–44)
AST: 16 U/L (ref 15–41)
Albumin: 3.9 g/dL (ref 3.5–5.0)
Alkaline Phosphatase: 70 U/L (ref 38–126)
Anion gap: 9 (ref 5–15)
BILIRUBIN TOTAL: 0.6 mg/dL (ref 0.3–1.2)
BUN: 21 mg/dL — AB (ref 6–20)
CHLORIDE: 99 mmol/L (ref 98–111)
CO2: 27 mmol/L (ref 22–32)
CREATININE: 1 mg/dL (ref 0.61–1.24)
Calcium: 9.2 mg/dL (ref 8.9–10.3)
Glucose, Bld: 113 mg/dL — ABNORMAL HIGH (ref 70–99)
Potassium: 4.3 mmol/L (ref 3.5–5.1)
Sodium: 135 mmol/L (ref 135–145)
Total Protein: 6.9 g/dL (ref 6.5–8.1)

## 2018-05-28 LAB — CBC WITH DIFFERENTIAL (CANCER CENTER ONLY)
Basophils Absolute: 0 10*3/uL (ref 0.0–0.1)
Basophils Relative: 0 %
EOS ABS: 0.1 10*3/uL (ref 0.0–0.5)
EOS PCT: 1 %
HCT: 46.3 % (ref 38.4–49.9)
Hemoglobin: 15.7 g/dL (ref 13.0–17.1)
Lymphocytes Relative: 12 %
Lymphs Abs: 1 10*3/uL (ref 0.9–3.3)
MCH: 29.3 pg (ref 27.2–33.4)
MCHC: 33.8 g/dL (ref 32.0–36.0)
MCV: 86.5 fL (ref 79.3–98.0)
MONO ABS: 0 10*3/uL — AB (ref 0.1–0.9)
MONOS PCT: 1 %
Neutro Abs: 6.8 10*3/uL — ABNORMAL HIGH (ref 1.5–6.5)
Neutrophils Relative %: 86 %
PLATELETS: 155 10*3/uL (ref 140–400)
RBC: 5.35 MIL/uL (ref 4.20–5.82)
RDW: 12.6 % (ref 11.0–14.6)
WBC: 7.9 10*3/uL (ref 4.0–10.3)

## 2018-05-28 MED ORDER — PROCHLORPERAZINE MALEATE 10 MG PO TABS
10.0000 mg | ORAL_TABLET | Freq: Once | ORAL | Status: AC
  Administered 2018-05-28: 10 mg via ORAL

## 2018-05-28 MED ORDER — HEPARIN SOD (PORK) LOCK FLUSH 100 UNIT/ML IV SOLN
500.0000 [IU] | Freq: Once | INTRAVENOUS | Status: AC | PRN
  Administered 2018-05-28: 500 [IU]
  Filled 2018-05-28: qty 5

## 2018-05-28 MED ORDER — SODIUM CHLORIDE 0.9% FLUSH
10.0000 mL | INTRAVENOUS | Status: DC | PRN
  Administered 2018-05-28: 10 mL
  Filled 2018-05-28: qty 10

## 2018-05-28 MED ORDER — PROCHLORPERAZINE MALEATE 10 MG PO TABS
ORAL_TABLET | ORAL | Status: AC
  Filled 2018-05-28: qty 1

## 2018-05-28 MED ORDER — SODIUM CHLORIDE 0.9 % IV SOLN
Freq: Once | INTRAVENOUS | Status: AC
  Administered 2018-05-28: 11:00:00 via INTRAVENOUS

## 2018-05-28 MED ORDER — SODIUM CHLORIDE 0.9 % IV SOLN
30.0000 [IU] | Freq: Once | INTRAVENOUS | Status: AC
  Administered 2018-05-28: 30 [IU] via INTRAVENOUS
  Filled 2018-05-28: qty 10

## 2018-05-28 MED ORDER — SODIUM CHLORIDE 0.9% FLUSH
10.0000 mL | Freq: Once | INTRAVENOUS | Status: AC
  Administered 2018-05-28: 10 mL
  Filled 2018-05-28: qty 10

## 2018-05-28 NOTE — Progress Notes (Signed)
FMLA successfully faxed to [employer] at [phone]. Mailed copy to patient address on file. 

## 2018-05-28 NOTE — Telephone Encounter (Signed)
Appointments scheduled AVS/Calendar printed per 6/25 los °

## 2018-05-28 NOTE — Progress Notes (Signed)
Hematology and Oncology Follow Up Visit  Greg Peterson [id number] Aug 18, 1996 22 y.o. 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  He presented with T2N0 in January 2019 and developed recurrent disease subsequently.  Prior Therapy:  He is status post orchiectomy completed on January 04, 2018.  The final pathology showed pure seminoma with lymphovascular invasion.  He developed recurrent disease with 3.2 cm periaortic lymphadenopathy and elevated hCG of 121 and LDH of 367  Current therapy: BEP chemotherapy started on 05/20/2018.  Today is day 9 of therapy.  Interim History: Mr. Greg Peterson is here for a follow-up.  Since the last visit, he had a Port-A-Cath inserted and started BEP chemotherapy on 05/20/2018.  He tolerated chemotherapy without any major complications.  He denies any nausea or vomiting.  He did have hemorrhoidal pain which is still an issue periodically.  He does describe an unsettling feeling in his stomach with loose bowel habits at times.  He denies any diarrhea or hematochezia.  He remains active and attends to activities of daily living.   He does not report any headaches, blurry vision, syncope or seizures.  He denies any alteration of mental status or confusion.  Does not report any fevers, chills or sweats.  Does not report any cough, wheezing or hemoptysis.  Does not report any chest pain, palpitation, orthopnea or leg edema.    He denies any dyspnea on exertion or shortness of breath.  Does not report any nausea, vomiting or abdominal pain.  Does not report any constipation or diarrhea.    Does not report any hematochezia or melena.  Does not report any bone pain or pathological fractures.   Does not report frequency, urgency or hematuria.  Does not report any skin rashes or lesions. Does not report any heat or cold intolerance.  Does not report any lymphadenopathy or  petechiae.   Remaining review of systems is negative.    Medications: I have reviewed the patient's current medications.  Current Outpatient Medications  Medication Sig Dispense Refill  . citalopram (CELEXA) 10 MG tablet Take 10 mg by mouth daily.    Marland Kitchen HYDROcodone-acetaminophen (NORCO) 5-325 MG tablet Take 1 tablet by mouth every 6 (six) hours as needed for moderate pain. (Patient not taking: Reported on 01/30/2018) 6 tablet 0  . hydrocortisone (ANUSOL-HC) 2.5 % rectal cream Place 1 application rectally 2 (two) times daily. 30 g 2  . ibuprofen (ADVIL,MOTRIN) 200 MG tablet Take 200 mg by mouth every 6 (six) hours as needed.    . lidocaine-prilocaine (EMLA) cream Apply 1 application topically as needed. Apply to port area 1-2 hours before coming to [facility] for treatment 30 g 1  . prochlorperazine (COMPAZINE) 10 MG tablet Take 1 tablet (10 mg total) by mouth every 6 (six) hours as needed for nausea or vomiting. 30 tablet 0   No current facility-administered medications for this visit.      Allergies: No Known Allergies  Past Medical History, Surgical history, Social history, and Family History were reviewed and updated.    Physical Exam: Blood pressure 119/84, pulse 85, temperature 97.9 F (36.6 C), temperature source Oral, resp. rate 18, height 5\' 11"  (1.803 m), weight 184 lb 14.4 oz (83.9 kg), SpO2 98 %.   ECOG: 0 General appearance: Well-appearing gentleman without distress. Head: Atraumatic without abnormalities. Oropharynx: No ulcers or lesions. Eyes: Sclera anicteric. Lymph nodes: No lymphadenopathy noted in  the cervical, supraclavicular, or axillary nodes  Heart:regular rate and rhythm, there are any murmurs or gallops. Lung: clear to auscultation without any rhonchi wheezes or dullness to percussion. Abdomin: soft, any rebound or guarding.  No shifting dullness or ascites.  Good bowel sounds auscultated. Neurological: No motor or sensory deficits.  Normal gait. Skin:  Moist without any ecchymosis or petechiae. Musculoskeletal: No clubbing or cyanosis. Psychiatric: Appropriate mood and affect.    Lab Results: Lab Results  Component Value Date   WBC 6.3 05/20/2018   HGB 15.6 05/20/2018   HCT 45.8 05/20/2018   MCV 88.2 05/20/2018   PLT 221 05/20/2018     Chemistry      Component Value Date/Time   NA 137 05/20/2018 0825   K 4.1 05/20/2018 0825   CL 105 05/20/2018 0825   CO2 24 05/20/2018 0825   BUN 20 05/20/2018 0825   CREATININE 1.06 05/20/2018 0825      Component Value Date/Time   CALCIUM 9.4 05/20/2018 0825   ALKPHOS 84 05/20/2018 0825   AST 22 05/20/2018 0825   ALT 34 05/20/2018 0825   BILITOT 0.9 05/20/2018 0825       Impression and Plan:   22 year old man with the following issues:  1.  Stage IIC pure seminoma documented in May 2019 after initial presentation of T2N0 of the right testicle diagnosed February 2019.    He is currently receiving BEP chemotherapy without major complications.  He has tolerated day 1 through 5 without any issues.  He is ready to proceed with a 9 of chemotherapy today without any dose reduction or delay.  The plan is to complete 3 cycles of BEP chemotherapy and will have repeat imaging studies at this time.   2.  IV access: Port-A-Cath inserted without any complications..  3.  Antiemetics: Compazine is available to him without any major nausea or vomiting.  4.  Renal function surveillance: His creatinine continues to be normal after cisplatin therapy.  5.  Pulmonary function assessment: His baseline DLCO was obtained and will be repeated after each cycle of chemotherapy.  6.  Prognosis: The goal of therapy is curative.  7.  Dyspepsia and hemorrhoids: Supportive management is recommended at this time and appropriate prescription was given to him including hemorrhoidal cream.  I have asked him to use antiacids for his GI symptoms.    8.  Follow-up: We will be on July 2 for day 16 of cycle 1.  And  he will start out cycle 2 of chemotherapy on June 10, 2018.  25  minutes was spent with the patient face-to-face today.  More than 50% of time was dedicated to patient counseling, education and coordinating his multifaceted care including dressing issues related to systemic chemotherapy.    Zola Button, MD 6/25/201910:17 AM

## 2018-05-28 NOTE — Patient Instructions (Signed)
[facility] Discharge Instructions for Patients Receiving Chemotherapy  Today you received the following chemotherapy agents Bleomycin  To help prevent nausea and vomiting after your treatment, we encourage you to take your nausea medication as directed.   If you develop nausea and vomiting that is not controlled by your nausea medication, call the clinic.   BELOW ARE SYMPTOMS THAT SHOULD BE REPORTED IMMEDIATELY:  *FEVER GREATER THAN 100.5 F  *CHILLS WITH OR WITHOUT FEVER  NAUSEA AND VOMITING THAT IS NOT CONTROLLED WITH YOUR NAUSEA MEDICATION  *UNUSUAL SHORTNESS OF BREATH  *UNUSUAL BRUISING OR BLEEDING  TENDERNESS IN MOUTH AND THROAT WITH OR WITHOUT PRESENCE OF ULCERS  *URINARY PROBLEMS  *BOWEL PROBLEMS  UNUSUAL RASH Items with * indicate a potential emergency and should be followed up as soon as possible.  Feel free to call the clinic should you have any questions or concerns. The clinic phone number is [phone].  Please show the CHEMO ALERT CARD at check-in to the Emergency Department and triage nurse.   

## 2018-06-04 ENCOUNTER — Inpatient Hospital Stay: Payer: BLUE CROSS/BLUE SHIELD | Attending: Oncology

## 2018-06-04 ENCOUNTER — Inpatient Hospital Stay: Payer: BLUE CROSS/BLUE SHIELD

## 2018-06-04 VITALS — BP 96/85 | HR 89 | Temp 98.4°F | Resp 18

## 2018-06-04 DIAGNOSIS — T451X5A Adverse effect of antineoplastic and immunosuppressive drugs, initial encounter: Secondary | ICD-10-CM | POA: Diagnosis not present

## 2018-06-04 DIAGNOSIS — K649 Unspecified hemorrhoids: Secondary | ICD-10-CM | POA: Insufficient documentation

## 2018-06-04 DIAGNOSIS — R1013 Epigastric pain: Secondary | ICD-10-CM | POA: Insufficient documentation

## 2018-06-04 DIAGNOSIS — C6290 Malignant neoplasm of unspecified testis, unspecified whether descended or undescended: Secondary | ICD-10-CM

## 2018-06-04 DIAGNOSIS — C6291 Malignant neoplasm of right testis, unspecified whether descended or undescended: Secondary | ICD-10-CM | POA: Insufficient documentation

## 2018-06-04 DIAGNOSIS — Z9079 Acquired absence of other genital organ(s): Secondary | ICD-10-CM | POA: Diagnosis not present

## 2018-06-04 DIAGNOSIS — R21 Rash and other nonspecific skin eruption: Secondary | ICD-10-CM | POA: Insufficient documentation

## 2018-06-04 DIAGNOSIS — Z95828 Presence of other vascular implants and grafts: Secondary | ICD-10-CM

## 2018-06-04 DIAGNOSIS — Z5111 Encounter for antineoplastic chemotherapy: Secondary | ICD-10-CM | POA: Insufficient documentation

## 2018-06-04 LAB — CBC WITH DIFFERENTIAL (CANCER CENTER ONLY)
BASOS ABS: 0 10*3/uL (ref 0.0–0.1)
BASOS PCT: 0 %
Eosinophils Absolute: 0.1 10*3/uL (ref 0.0–0.5)
Eosinophils Relative: 5 %
HEMATOCRIT: 40.3 % (ref 38.4–49.9)
Hemoglobin: 13.6 g/dL (ref 13.0–17.1)
LYMPHS PCT: 70 %
Lymphs Abs: 1.8 10*3/uL (ref 0.9–3.3)
MCH: 28.7 pg (ref 27.2–33.4)
MCHC: 33.8 g/dL (ref 32.0–36.0)
MCV: 85.1 fL (ref 79.3–98.0)
Monocytes Absolute: 0.4 10*3/uL (ref 0.1–0.9)
Monocytes Relative: 16 %
Neutro Abs: 0.2 10*3/uL — CL (ref 1.5–6.5)
Neutrophils Relative %: 9 %
PLATELETS: 222 10*3/uL (ref 140–400)
RBC: 4.73 MIL/uL (ref 4.20–5.82)
RDW: 12.4 % (ref 11.0–14.6)
WBC Count: 2.6 10*3/uL — ABNORMAL LOW (ref 4.0–10.3)

## 2018-06-04 LAB — CMP (CANCER CENTER ONLY)
ALBUMIN: 3.8 g/dL (ref 3.5–5.0)
ALT: 37 U/L (ref 0–44)
AST: 18 U/L (ref 15–41)
Alkaline Phosphatase: 71 U/L (ref 38–126)
Anion gap: 6 (ref 5–15)
BILIRUBIN TOTAL: 0.6 mg/dL (ref 0.3–1.2)
BUN: 17 mg/dL (ref 6–20)
CALCIUM: 9.3 mg/dL (ref 8.9–10.3)
CO2: 28 mmol/L (ref 22–32)
Chloride: 109 mmol/L (ref 98–111)
Creatinine: 0.93 mg/dL (ref 0.61–1.24)
GFR, Est AFR Am: >60 mL/min (ref >60)
GLUCOSE: 100 mg/dL — AB (ref 70–99)
POTASSIUM: 3.4 mmol/L — AB (ref 3.5–5.1)
Sodium: 143 mmol/L (ref 135–145)
TOTAL PROTEIN: 6.9 g/dL (ref 6.5–8.1)

## 2018-06-04 LAB — LACTATE DEHYDROGENASE: LDH: 227 U/L — ABNORMAL HIGH (ref 98–192)

## 2018-06-04 MED ORDER — SODIUM CHLORIDE 0.9 % IV SOLN
30.0000 [IU] | Freq: Once | INTRAVENOUS | Status: AC
  Administered 2018-06-04: 30 [IU] via INTRAVENOUS
  Filled 2018-06-04: qty 10

## 2018-06-04 MED ORDER — SODIUM CHLORIDE 0.9 % IV SOLN
Freq: Once | INTRAVENOUS | Status: AC
  Administered 2018-06-04: 16:00:00 via INTRAVENOUS

## 2018-06-04 MED ORDER — SODIUM CHLORIDE 0.9% FLUSH
10.0000 mL | INTRAVENOUS | Status: DC | PRN
  Administered 2018-06-04: 10 mL
  Filled 2018-06-04: qty 10

## 2018-06-04 MED ORDER — HEPARIN SOD (PORK) LOCK FLUSH 100 UNIT/ML IV SOLN
500.0000 [IU] | Freq: Once | INTRAVENOUS | Status: AC | PRN
  Administered 2018-06-04: 500 [IU]
  Filled 2018-06-04: qty 5

## 2018-06-04 MED ORDER — PROCHLORPERAZINE MALEATE 10 MG PO TABS
ORAL_TABLET | ORAL | Status: AC
Start: 2018-06-04 — End: ?
  Filled 2018-06-04: qty 1

## 2018-06-04 MED ORDER — SODIUM CHLORIDE 0.9% FLUSH
10.0000 mL | Freq: Once | INTRAVENOUS | Status: AC
  Administered 2018-06-04: 10 mL
  Filled 2018-06-04: qty 10

## 2018-06-04 MED ORDER — PROCHLORPERAZINE MALEATE 10 MG PO TABS
10.0000 mg | ORAL_TABLET | Freq: Once | ORAL | Status: AC
  Administered 2018-06-04: 10 mg via ORAL

## 2018-06-04 NOTE — Patient Instructions (Signed)
[facility] Discharge Instructions for Patients Receiving Chemotherapy  Today you received the following chemotherapy agents Bleomycin  To help prevent nausea and vomiting after your treatment, we encourage you to take your nausea medication as directed.   If you develop nausea and vomiting that is not controlled by your nausea medication, call the clinic.   BELOW ARE SYMPTOMS THAT SHOULD BE REPORTED IMMEDIATELY:  *FEVER GREATER THAN 100.5 F  *CHILLS WITH OR WITHOUT FEVER  NAUSEA AND VOMITING THAT IS NOT CONTROLLED WITH YOUR NAUSEA MEDICATION  *UNUSUAL SHORTNESS OF BREATH  *UNUSUAL BRUISING OR BLEEDING  TENDERNESS IN MOUTH AND THROAT WITH OR WITHOUT PRESENCE OF ULCERS  *URINARY PROBLEMS  *BOWEL PROBLEMS  UNUSUAL RASH Items with * indicate a potential emergency and should be followed up as soon as possible.  Feel free to call the clinic should you have any questions or concerns. The clinic phone number is [phone].  Please show the CHEMO ALERT CARD at check-in to the Emergency Department and triage nurse.   

## 2018-06-04 NOTE — Progress Notes (Signed)
Ok to proceed with treatment with 7/2 0.2 ANC per Dr. Alvy Bimler.

## 2018-06-05 LAB — BETA HCG QUANT (REF LAB): hCG Quant: 12 m[IU]/mL — ABNORMAL HIGH (ref 0–3)

## 2018-06-07 DIAGNOSIS — Z5111 Encounter for antineoplastic chemotherapy: Secondary | ICD-10-CM | POA: Insufficient documentation

## 2018-06-07 NOTE — Progress Notes (Signed)
Hematology and Oncology Follow Up Visit  Greg Peterson [id number] 09-Apr-1996 22 y.o. 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  He presented with T2N0 in January 2019 and developed recurrent disease subsequently.  Prior Therapy:  He is status post orchiectomy completed on January 04, 2018.  The final pathology showed pure seminoma with lymphovascular invasion.  He developed recurrent disease with 3.2 cm periaortic lymphadenopathy and elevated hCG of 121 and LDH of 367  Current therapy: BEP chemotherapy started on 05/20/2018.  Today is day 1 of cycle 2.  Interim History: Greg Peterson is here for a follow-up.  The patient received his first cycle of chemotherapy without any major complications.  Reports intermittent nausea and has vomited on a couple of occasions.  Not had any nausea or vomiting today.  He takes Compazine as needed. He does describe an unsettling feeling in his stomach with loose bowel habits at times.  He denies any diarrhea or hematochezia.  He remains active and attends to activities of daily living.  He does not report any headaches, blurry vision, syncope or seizures.  He denies any alteration of mental status or confusion.  Does not report any fevers, chills or sweats.  Does not report any cough, wheezing or hemoptysis.  Does not report any chest pain, palpitation, orthopnea or leg edema.    He denies any dyspnea on exertion or shortness of breath.  Does not report any nausea, vomiting or abdominal pain.  Does not report any constipation or diarrhea.    Does not report any hematochezia or melena.  Does not report any bone pain or pathological fractures.   Does not report frequency, urgency or hematuria.  Does not report any skin rashes or lesions. Does not report any heat or cold intolerance.  Does not report any lymphadenopathy or petechiae.   Remaining review of  systems is negative.  The patient is here for evaluation prior to day 1 of cycle #2.   Medications: I have reviewed the patient's current medications.  Current Outpatient Medications  Medication Sig Dispense Refill  . citalopram (CELEXA) 10 MG tablet Take 10 mg by mouth daily.    Marland Kitchen HYDROcodone-acetaminophen (NORCO) 5-325 MG tablet Take 1 tablet by mouth every 6 (six) hours as needed for moderate pain. (Patient not taking: Reported on 01/30/2018) 6 tablet 0  . hydrocortisone (ANUSOL-HC) 2.5 % rectal cream Place 1 application rectally 2 (two) times daily. 30 g 2  . ibuprofen (ADVIL,MOTRIN) 200 MG tablet Take 200 mg by mouth every 6 (six) hours as needed.    . lidocaine-prilocaine (EMLA) cream Apply 1 application topically as needed. Apply to port area 1-2 hours before coming to [facility] for treatment 30 g 1  . prochlorperazine (COMPAZINE) 10 MG tablet Take 1 tablet (10 mg total) by mouth every 6 (six) hours as needed for nausea or vomiting. (Patient not taking: Reported on 05/28/2018) 30 tablet 0   No current facility-administered medications for this visit.      Allergies: No Known Allergies  Past Medical History, Surgical history, Social history, and Family History were reviewed and updated.    Physical Exam: Blood pressure 110/79, pulse 82, temperature 97.6 F (36.4 C), temperature source Oral, resp. rate 18, height 5\' 11"  (1.803 m), weight 181 lb 9.6 oz (82.4 kg), SpO2 99 %.  ECOG: 0 General appearance: Well-appearing gentleman without distress. Head: Atraumatic without abnormalities. Oropharynx:  No ulcers or lesions. Eyes: Sclera anicteric. Lymph nodes: No lymphadenopathy noted in the cervical, supraclavicular, or axillary nodes  Heart:regular rate and rhythm, there are any murmurs or gallops. Lung: clear to auscultation without any rhonchi wheezes or dullness to percussion. Abdomin: soft, any rebound or guarding.  No shifting dullness or ascites.  Good bowel sounds  auscultated. Neurological: No motor or sensory deficits.  Normal gait. Skin: Moist without any ecchymosis or petechiae. Musculoskeletal: No clubbing or cyanosis. Psychiatric: Appropriate mood and affect.    Lab Results: Lab Results  Component Value Date   WBC 2.6 (L) 06/04/2018   HGB 13.6 06/04/2018   HCT 40.3 06/04/2018   MCV 85.1 06/04/2018   PLT 222 06/04/2018     Chemistry      Component Value Date/Time   NA 143 06/04/2018 1422   K 3.4 (L) 06/04/2018 1422   CL 109 06/04/2018 1422   CO2 28 06/04/2018 1422   BUN 17 06/04/2018 1422   CREATININE 0.93 06/04/2018 1422      Component Value Date/Time   CALCIUM 9.3 06/04/2018 1422   ALKPHOS 71 06/04/2018 1422   AST 18 06/04/2018 1422   ALT 37 06/04/2018 1422   BILITOT 0.6 06/04/2018 1422       Impression and Plan:   22 year old man with the following issues:  1.  Stage IIC pure seminoma documented in May 2019 after initial presentation of T2N0 of the right testicle diagnosed February 2019.    He is currently receiving BEP chemotherapy without major complications.  He tolerated the first cycle of chemotherapy well overall with no concerning complaints.  Labs reviewed and are adequate to proceed with treatment.  He will proceed with day 1 of cycle 2 of his treatment today as scheduled. The plan is to complete 3 cycles of BEP chemotherapy and will have repeat imaging studies at this time.  2.  IV access: Port-A-Cath inserted without any complications..  3.  Antiemetics: Compazine is available to him without any major nausea or vomiting.  4.  Renal function surveillance: His creatinine continues to be normal after cisplatin therapy.  5.  Pulmonary function assessment: His baseline DLCO was obtained and will be repeated after each cycle of chemotherapy.  This will need to be scheduled for next week.  6.  Prognosis: The goal of therapy is curative.  7.  Dyspepsia and hemorrhoids: I have asked him to use antiacids for his  GI symptoms.    8.  Follow-up: On 06/18/2018 as previously scheduled for day 9 of cycle 2.   Mikey Bussing, DNP, AGPCNP-BC, AOCNP 7/5/20193:28 PM

## 2018-06-10 ENCOUNTER — Inpatient Hospital Stay (HOSPITAL_BASED_OUTPATIENT_CLINIC_OR_DEPARTMENT_OTHER): Payer: BLUE CROSS/BLUE SHIELD

## 2018-06-10 ENCOUNTER — Inpatient Hospital Stay: Payer: BLUE CROSS/BLUE SHIELD

## 2018-06-10 ENCOUNTER — Encounter: Payer: Self-pay | Admitting: Oncology

## 2018-06-10 ENCOUNTER — Inpatient Hospital Stay (HOSPITAL_BASED_OUTPATIENT_CLINIC_OR_DEPARTMENT_OTHER): Payer: BLUE CROSS/BLUE SHIELD | Admitting: Medical

## 2018-06-10 ENCOUNTER — Inpatient Hospital Stay: Payer: BLUE CROSS/BLUE SHIELD | Admitting: Oncology

## 2018-06-10 VITALS — BP 110/79 | HR 82 | Temp 97.6°F | Resp 18 | Ht 71.0 in | Wt 181.6 lb

## 2018-06-10 VITALS — BP 126/84 | HR 82 | Resp 18

## 2018-06-10 DIAGNOSIS — T451X5A Adverse effect of antineoplastic and immunosuppressive drugs, initial encounter: Secondary | ICD-10-CM

## 2018-06-10 DIAGNOSIS — C6291 Malignant neoplasm of right testis, unspecified whether descended or undescended: Secondary | ICD-10-CM | POA: Diagnosis not present

## 2018-06-10 DIAGNOSIS — M79652 Pain in left thigh: Secondary | ICD-10-CM

## 2018-06-10 DIAGNOSIS — C6290 Malignant neoplasm of unspecified testis, unspecified whether descended or undescended: Secondary | ICD-10-CM

## 2018-06-10 DIAGNOSIS — R109 Unspecified abdominal pain: Secondary | ICD-10-CM | POA: Diagnosis not present

## 2018-06-10 DIAGNOSIS — R230 Cyanosis: Secondary | ICD-10-CM

## 2018-06-10 DIAGNOSIS — R0602 Shortness of breath: Secondary | ICD-10-CM | POA: Diagnosis not present

## 2018-06-10 DIAGNOSIS — R1013 Epigastric pain: Secondary | ICD-10-CM

## 2018-06-10 DIAGNOSIS — K649 Unspecified hemorrhoids: Secondary | ICD-10-CM | POA: Diagnosis not present

## 2018-06-10 DIAGNOSIS — M79651 Pain in right thigh: Secondary | ICD-10-CM

## 2018-06-10 DIAGNOSIS — Z95828 Presence of other vascular implants and grafts: Secondary | ICD-10-CM

## 2018-06-10 DIAGNOSIS — R231 Pallor: Secondary | ICD-10-CM | POA: Diagnosis not present

## 2018-06-10 DIAGNOSIS — Z5111 Encounter for antineoplastic chemotherapy: Secondary | ICD-10-CM

## 2018-06-10 LAB — CBC WITH DIFFERENTIAL (CANCER CENTER ONLY)
BASOS PCT: 1 %
Basophils Absolute: 0.1 10*3/uL (ref 0.0–0.1)
EOS ABS: 0.3 10*3/uL (ref 0.0–0.5)
Eosinophils Relative: 5 %
HCT: 43.7 % (ref 38.4–49.9)
HEMOGLOBIN: 15.1 g/dL (ref 13.0–17.1)
Lymphocytes Relative: 45 %
Lymphs Abs: 2.3 10*3/uL (ref 0.9–3.3)
MCH: 29.3 pg (ref 27.2–33.4)
MCHC: 34.5 g/dL (ref 32.0–36.0)
MCV: 84.8 fL (ref 79.3–98.0)
Monocytes Absolute: 1 10*3/uL — ABNORMAL HIGH (ref 0.1–0.9)
Monocytes Relative: 18 %
NEUTROS PCT: 31 %
Neutro Abs: 1.6 10*3/uL (ref 1.5–6.5)
PLATELETS: 311 10*3/uL (ref 140–400)
RBC: 5.15 MIL/uL (ref 4.20–5.82)
RDW: 13.5 % (ref 11.0–14.6)
WBC: 5.2 10*3/uL (ref 4.0–10.3)

## 2018-06-10 LAB — CMP (CANCER CENTER ONLY)
ALT: 38 U/L (ref 0–44)
AST: 19 U/L (ref 15–41)
Albumin: 4.2 g/dL (ref 3.5–5.0)
Alkaline Phosphatase: 85 U/L (ref 38–126)
Anion gap: 7 (ref 5–15)
BUN: 19 mg/dL (ref 6–20)
CHLORIDE: 107 mmol/L (ref 98–111)
CO2: 27 mmol/L (ref 22–32)
Calcium: 9.9 mg/dL (ref 8.9–10.3)
Creatinine: 1.13 mg/dL (ref 0.61–1.24)
GFR, Est AFR Am: >60 mL/min (ref >60)
Glucose, Bld: 91 mg/dL (ref 70–99)
POTASSIUM: 4.2 mmol/L (ref 3.5–5.1)
Sodium: 141 mmol/L (ref 135–145)
Total Bilirubin: 0.4 mg/dL (ref 0.3–1.2)
Total Protein: 7.8 g/dL (ref 6.5–8.1)

## 2018-06-10 MED ORDER — DEXTROSE-NACL 5-0.45 % IV SOLN
Freq: Once | INTRAVENOUS | Status: AC
  Administered 2018-06-10: 10:00:00 via INTRAVENOUS
  Filled 2018-06-10: qty 10

## 2018-06-10 MED ORDER — METHYLPREDNISOLONE SODIUM SUCC 125 MG IJ SOLR
125.0000 mg | Freq: Once | INTRAMUSCULAR | Status: AC
  Administered 2018-06-10: 125 mg via INTRAVENOUS

## 2018-06-10 MED ORDER — EPINEPHRINE 0.3 MG/0.3ML IJ SOAJ
0.3000 mg | Freq: Once | INTRAMUSCULAR | Status: AC
  Administered 2018-06-10: 0.3 mg via INTRAMUSCULAR
  Filled 2018-06-10: qty 0.3

## 2018-06-10 MED ORDER — PALONOSETRON HCL INJECTION 0.25 MG/5ML
INTRAVENOUS | Status: AC
  Filled 2018-06-10: qty 5

## 2018-06-10 MED ORDER — ETOPOSIDE CHEMO INJECTION 1 GM/50ML
99.0000 mg/m2 | Freq: Once | INTRAVENOUS | Status: AC
  Administered 2018-06-10: 200 mg via INTRAVENOUS
  Filled 2018-06-10: qty 10

## 2018-06-10 MED ORDER — ALBUTEROL SULFATE (2.5 MG/3ML) 0.083% IN NEBU
2.5000 mg | INHALATION_SOLUTION | Freq: Once | RESPIRATORY_TRACT | Status: DC
  Filled 2018-06-10: qty 3

## 2018-06-10 MED ORDER — SODIUM CHLORIDE 0.9 % IV SOLN
Freq: Once | INTRAVENOUS | Status: AC
  Administered 2018-06-10: 13:00:00 via INTRAVENOUS
  Filled 2018-06-10: qty 5

## 2018-06-10 MED ORDER — SODIUM CHLORIDE 0.9% FLUSH
10.0000 mL | INTRAVENOUS | Status: DC | PRN
  Administered 2018-06-10: 10 mL
  Filled 2018-06-10: qty 10

## 2018-06-10 MED ORDER — IPRATROPIUM-ALBUTEROL 0.5-2.5 (3) MG/3ML IN SOLN
3.0000 mL | Freq: Four times a day (QID) | RESPIRATORY_TRACT | Status: DC
  Administered 2018-06-10: 3 mL via RESPIRATORY_TRACT

## 2018-06-10 MED ORDER — DIPHENHYDRAMINE HCL 50 MG/ML IJ SOLN
50.0000 mg | Freq: Once | INTRAMUSCULAR | Status: AC
  Administered 2018-06-10: 50 mg via INTRAVENOUS

## 2018-06-10 MED ORDER — SODIUM CHLORIDE 0.9% FLUSH
10.0000 mL | Freq: Once | INTRAVENOUS | Status: AC
  Administered 2018-06-10: 10 mL
  Filled 2018-06-10: qty 10

## 2018-06-10 MED ORDER — SODIUM CHLORIDE 0.9 % IV SOLN
Freq: Once | INTRAVENOUS | Status: AC
  Administered 2018-06-10: 09:00:00 via INTRAVENOUS

## 2018-06-10 MED ORDER — FAMOTIDINE IN NACL 20-0.9 MG/50ML-% IV SOLN
20.0000 mg | Freq: Two times a day (BID) | INTRAVENOUS | Status: DC
  Administered 2018-06-10: 20 mg via INTRAVENOUS

## 2018-06-10 MED ORDER — HEPARIN SOD (PORK) LOCK FLUSH 100 UNIT/ML IV SOLN
500.0000 [IU] | Freq: Once | INTRAVENOUS | Status: AC | PRN
Start: 2018-06-10 — End: 2018-06-10
  Administered 2018-06-10: 500 [IU]
  Filled 2018-06-10: qty 5

## 2018-06-10 MED ORDER — CISPLATIN CHEMO INJECTION 100MG/100ML
20.0000 mg/m2 | Freq: Once | INTRAVENOUS | Status: DC
  Filled 2018-06-10: qty 41

## 2018-06-10 MED ORDER — PALONOSETRON HCL INJECTION 0.25 MG/5ML
0.2500 mg | Freq: Once | INTRAVENOUS | Status: AC
  Administered 2018-06-10: 0.25 mg via INTRAVENOUS

## 2018-06-10 NOTE — Progress Notes (Signed)
DATE: 06/10/2018      X CHEMO/IMMUNOTHERAPY REACTION           MD: Dr. [name]  AGENT/BLOOD PRODUCT RECEIVING TODAY:   Cisplatin and etoposide  AGENT/BLOOD PRODUCT RECEIVING IMMEDIATELY PRIOR TO REACTION: Etoposide  VS: BP:     128/58 p:       110 SPO2:       88% on 2 LPM via nasal cannula        BP:     128/58 p:       100 SPO2:       100% on 5 L via nonrebreather mask        REACTION(S): Pallor, cyanosis, shortness of breath, hypoxia, abdominal burning, and pain in the posterior thighs bilaterally.  PREMEDS: Aloxi, dexamethasone, and Emend  INTERVENTION: Albuterol nebulizer, Benadryl 50 mg IV x1, epinephrine 0.2 mg subcu x1, Pepcid 20 mg IV x1, Solu-Medrol 125 mg IV x1.  This provider had been contacted regarding the patient's for reaction.  These interventions had already taken place prior to me seeing the patient.  A code was called due to the patient's continued shortness of breath and hypoxia despite receiving oxygen via nasal cannula.  This was canceled as the patient's oxygen saturation abruptly recovered to 97% on 5 LPM via nonrebreather mask.  The patient's oxygen saturation continued to stabilize despite tapering of his oxygen.  He ultimately achieved 98% on room air.  A safety zone was entered as it was determined that the patient's etoposide today was from a different manufacturer.  This patient was discussed and was seen with Dr. Irene Limbo.  The patient's parents were not present when this took place.  His father was contacted and made aware of the situation.  Review of Systems  Constitutional: Negative for chills and diaphoresis.  HENT: Negative for congestion, facial swelling and trouble swallowing.   Respiratory: Positive for shortness of breath. Negative for cough, choking, chest tightness and wheezing.   Cardiovascular: Negative for chest pain and palpitations.  Gastrointestinal: Positive for abdominal pain. Negative for nausea and vomiting.  Musculoskeletal: Positive  for myalgias. Negative for back pain.  Skin: Negative for rash.  Neurological: Negative for dizziness, speech difficulty and headaches.  Psychiatric/Behavioral: The patient is not nervous/anxious.     Physical Exam  Constitutional: He appears distressed (Shortness of breath which resolved after the patient was given an albuterol nebulizer, Benadryl IV, epinephrine subcu, Pepcid IV, and Solu-Medrol IV).  HENT:  Head: Normocephalic and atraumatic.  Cardiovascular: Normal rate, regular rhythm and normal heart sounds. Exam reveals no gallop and no friction rub.  No murmur heard. Pulmonary/Chest: Effort normal and breath sounds normal. No respiratory distress. He has no wheezes. He has no rales.  Neurological: He is alert.  Skin: Skin is warm and dry. No rash noted. He is not diaphoretic. No erythema. There is pallor.     OUTCOME: Dr. Sullivan Lone recommended holding the remainder of his etoposide today and foregoing dosing with cisplatin today.    Sandi Mealy, MHS, PA-C  This patient was seen with Dr. Irene Limbo with my treatment plan reviewed with him. He expressed agreement with my medical management of this patient.  .Patient was Personally and independently interviewed, examined and relevant elements of the history of present illness were reviewed in details and an assessment and plan was created. All elements of the patient's history of present illness , assessment and plan were discussed in details with Sandi Mealy PA-C. The above documentation reflects our combined  findings assessment and plan.  Sullivan Lone MD MS

## 2018-06-10 NOTE — Patient Instructions (Addendum)
[place] Discharge Instructions for Patients Receiving Chemotherapy  Today you received the following chemotherapy agent:  Chemo treatment not finished due to reaction.  Etoposide stopped d/t reaction.  Cisplatin not given To help prevent nausea and vomiting after your treatment, we encourage you to take your nausea medication as prescribed.   If you develop nausea and vomiting that is not controlled by your nausea medication, call the clinic.   BELOW ARE SYMPTOMS THAT SHOULD BE REPORTED IMMEDIATELY:  *FEVER GREATER THAN 100.5 F  *CHILLS WITH OR WITHOUT FEVER  NAUSEA AND VOMITING THAT IS NOT CONTROLLED WITH YOUR NAUSEA MEDICATION  *UNUSUAL SHORTNESS OF BREATH  *UNUSUAL BRUISING OR BLEEDING  TENDERNESS IN MOUTH AND THROAT WITH OR WITHOUT PRESENCE OF ULCERS  *URINARY PROBLEMS  *BOWEL PROBLEMS  UNUSUAL RASH Items with * indicate a potential emergency and should be followed up as soon as possible.  Feel free to call the clinic should you have any questions or concerns. The clinic phone number is [phone].  Please show the [place] at check-in to the Emergency Department and triage nurse.

## 2018-06-10 NOTE — Progress Notes (Signed)
Completed on 06/10/2018 

## 2018-06-10 NOTE — Progress Notes (Signed)
Per Dr. Irene Limbo ok to release treatment with a urine output of 285mL.

## 2018-06-10 NOTE — Progress Notes (Signed)
1330: Etoposide infustion stopped due to patient reaction. Pt noted to be coughing and turning red diffusely throughout body. Pt awake and able to respond to this RN but pt stated "I don't feel right" 1332:Emergency medications given per protocol; refer to [facility]. Pt skin noted to be deep red and pt diaphoretic. Pt placed on nasal cannula.  1335: Pt placed on non rebreather and nebulizer treatment started.  Pt remained awake and responsive during reaction. Normal saline infusing to patent port access.  Lucianne Lei, PA at bedside to evaluate pt. Pt c/o "stomach hurting" 1337: Pepcid infusing with normal saline.  1345: Pt noted to have even and unlabored respirations. Pt playing on tablet and closing eyes at times. Pt skin pale and dry. Pt placed in position in comfort. 1350: Pt eyes closed and no pain cues noted. Respirations equal and unlabored. 1400: Dr. Irene Limbo at chairside to evaluate pt. Report given to [facility], Therapist, sports.

## 2018-06-11 ENCOUNTER — Inpatient Hospital Stay (HOSPITAL_BASED_OUTPATIENT_CLINIC_OR_DEPARTMENT_OTHER): Payer: BLUE CROSS/BLUE SHIELD | Admitting: Medical

## 2018-06-11 ENCOUNTER — Telehealth: Payer: Self-pay

## 2018-06-11 ENCOUNTER — Inpatient Hospital Stay: Payer: BLUE CROSS/BLUE SHIELD

## 2018-06-11 ENCOUNTER — Other Ambulatory Visit: Payer: Self-pay | Admitting: Oncology

## 2018-06-11 VITALS — BP 121/72 | HR 96 | Temp 98.3°F

## 2018-06-11 DIAGNOSIS — C6290 Malignant neoplasm of unspecified testis, unspecified whether descended or undescended: Secondary | ICD-10-CM

## 2018-06-11 DIAGNOSIS — C6291 Malignant neoplasm of right testis, unspecified whether descended or undescended: Secondary | ICD-10-CM | POA: Diagnosis not present

## 2018-06-11 DIAGNOSIS — R21 Rash and other nonspecific skin eruption: Secondary | ICD-10-CM

## 2018-06-11 LAB — AFP TUMOR MARKER: AFP, SERUM, TUMOR MARKER: 2.9 ng/mL (ref 0.0–8.3)

## 2018-06-11 MED ORDER — FAMOTIDINE IN NACL 20-0.9 MG/50ML-% IV SOLN
INTRAVENOUS | Status: AC
  Filled 2018-06-11: qty 50

## 2018-06-11 MED ORDER — DEXAMETHASONE SODIUM PHOSPHATE 10 MG/ML IJ SOLN: 20.0000 mg | Freq: Once | INTRAMUSCULAR | Status: DC

## 2018-06-11 MED ORDER — HEPARIN SOD (PORK) LOCK FLUSH 100 UNIT/ML IV SOLN
500.0000 [IU] | Freq: Once | INTRAVENOUS | Status: AC | PRN
  Administered 2018-06-11: 500 [IU]
  Filled 2018-06-11: qty 5

## 2018-06-11 MED ORDER — DIPHENHYDRAMINE HCL 50 MG/ML IJ SOLN
INTRAMUSCULAR | Status: AC
Start: 2018-06-11 — End: ?
  Filled 2018-06-11: qty 1

## 2018-06-11 MED ORDER — SODIUM CHLORIDE 0.9 % IV SOLN
98.0000 mg/m2 | Freq: Once | INTRAVENOUS | Status: AC
  Administered 2018-06-11: 200 mg via INTRAVENOUS
  Filled 2018-06-11: qty 10

## 2018-06-11 MED ORDER — FAMOTIDINE IN NACL 20-0.9 MG/50ML-% IV SOLN
40.0000 mg | Freq: Once | INTRAVENOUS | Status: AC
  Administered 2018-06-11: 40 mg via INTRAVENOUS

## 2018-06-11 MED ORDER — CISPLATIN CHEMO INJECTION 100MG/100ML
20.0000 mg/m2 | Freq: Once | INTRAVENOUS | Status: AC
  Administered 2018-06-11: 41 mg via INTRAVENOUS
  Filled 2018-06-11: qty 41

## 2018-06-11 MED ORDER — DIPHENHYDRAMINE HCL 50 MG/ML IJ SOLN
50.0000 mg | Freq: Once | INTRAMUSCULAR | Status: AC
  Administered 2018-06-11: 50 mg via INTRAVENOUS

## 2018-06-11 MED ORDER — SODIUM CHLORIDE 0.9 % IV SOLN
20.0000 mg | Freq: Once | INTRAVENOUS | Status: AC
  Administered 2018-06-11: 20 mg via INTRAVENOUS
  Filled 2018-06-11: qty 2

## 2018-06-11 MED ORDER — POTASSIUM CHLORIDE 2 MEQ/ML IV SOLN
Freq: Once | INTRAVENOUS | Status: AC
  Administered 2018-06-11: 10:00:00 via INTRAVENOUS
  Filled 2018-06-11: qty 10

## 2018-06-11 MED ORDER — SODIUM CHLORIDE 0.9% FLUSH
10.0000 mL | INTRAVENOUS | Status: DC | PRN
  Administered 2018-06-11: 10 mL
  Filled 2018-06-11: qty 10

## 2018-06-11 MED ORDER — SODIUM CHLORIDE 0.9 % IV SOLN
Freq: Once | INTRAVENOUS | Status: AC
  Administered 2018-06-11: 10:00:00 via INTRAVENOUS

## 2018-06-11 NOTE — Telephone Encounter (Signed)
Received message from Sandi Mealy, PA that pt was wondering about needing a form for completion of his PFT's. No form needed, but pt to refrain from caffeine, smoking, or inhaler use for 4 hours prior to test. Information given to infusion RN to be relayed to pt.

## 2018-06-11 NOTE — Progress Notes (Signed)
Am: Sandi Mealy, PA in to see pt for rash on chest, recommended pt not use press and seal over EMLA cream anymore and try saran wrap instead.   1200 Per Dr. [name], okay to tx with 180cc urine output.

## 2018-06-11 NOTE — Patient Instructions (Addendum)
[place] Discharge Instructions for Patients Receiving Chemotherapy  Today you received the following chemotherapy agents: Cisplatin and Etoposide.  To help prevent nausea and vomiting after your treatment, we encourage you to take your nausea medication as prescribed.   If you develop nausea and vomiting that is not controlled by your nausea medication, call the clinic.   BELOW ARE SYMPTOMS THAT SHOULD BE REPORTED IMMEDIATELY:  *FEVER GREATER THAN 100.5 F  *CHILLS WITH OR WITHOUT FEVER  NAUSEA AND VOMITING THAT IS NOT CONTROLLED WITH YOUR NAUSEA MEDICATION  *UNUSUAL SHORTNESS OF BREATH  *UNUSUAL BRUISING OR BLEEDING  TENDERNESS IN MOUTH AND THROAT WITH OR WITHOUT PRESENCE OF ULCERS  *URINARY PROBLEMS  *BOWEL PROBLEMS  UNUSUAL RASH Items with * indicate a potential emergency and should be followed up as soon as possible.  Feel free to call the clinic should you have any questions or concerns. The clinic phone number is [phone].  Please show the [place] at check-in to the Emergency Department and triage nurse.    DO NOT USE PRESS AND SEAL ON YOUR PORT SITE ANYMORE. Try using saran wrap instead. :) It is still okay to use the numbing cream on your port site!  There is no form to take for the PFT (Pulmonary Function Test) For 4 hours before the test no caffeine, inhalers or tobacco

## 2018-06-12 ENCOUNTER — Inpatient Hospital Stay (HOSPITAL_BASED_OUTPATIENT_CLINIC_OR_DEPARTMENT_OTHER): Payer: BLUE CROSS/BLUE SHIELD | Admitting: Medical

## 2018-06-12 ENCOUNTER — Inpatient Hospital Stay: Payer: BLUE CROSS/BLUE SHIELD

## 2018-06-12 VITALS — BP 128/85 | HR 102 | Temp 97.8°F | Resp 18

## 2018-06-12 DIAGNOSIS — R21 Rash and other nonspecific skin eruption: Secondary | ICD-10-CM

## 2018-06-12 DIAGNOSIS — C6291 Malignant neoplasm of right testis, unspecified whether descended or undescended: Secondary | ICD-10-CM | POA: Diagnosis not present

## 2018-06-12 DIAGNOSIS — C6290 Malignant neoplasm of unspecified testis, unspecified whether descended or undescended: Secondary | ICD-10-CM

## 2018-06-12 MED ORDER — HEPARIN SOD (PORK) LOCK FLUSH 100 UNIT/ML IV SOLN
500.0000 [IU] | Freq: Once | INTRAVENOUS | Status: AC | PRN
  Administered 2018-06-12: 500 [IU]
  Filled 2018-06-12: qty 5

## 2018-06-12 MED ORDER — SODIUM CHLORIDE 0.9 % IV SOLN
20.0000 mg/m2 | Freq: Once | INTRAVENOUS | Status: AC
  Administered 2018-06-12: 41 mg via INTRAVENOUS
  Filled 2018-06-12: qty 41

## 2018-06-12 MED ORDER — PALONOSETRON HCL INJECTION 0.25 MG/5ML
0.2500 mg | Freq: Once | INTRAVENOUS | Status: AC
  Administered 2018-06-12: 0.25 mg via INTRAVENOUS

## 2018-06-12 MED ORDER — FAMOTIDINE IN NACL 20-0.9 MG/50ML-% IV SOLN
INTRAVENOUS | Status: AC
  Filled 2018-06-12: qty 100

## 2018-06-12 MED ORDER — SODIUM CHLORIDE 0.9% FLUSH
10.0000 mL | INTRAVENOUS | Status: DC | PRN
  Administered 2018-06-12: 10 mL
  Filled 2018-06-12: qty 10

## 2018-06-12 MED ORDER — FOSAPREPITANT DIMEGLUMINE INJECTION 150 MG
Freq: Once | INTRAVENOUS | Status: AC
  Administered 2018-06-12: 13:00:00 via INTRAVENOUS
  Filled 2018-06-12: qty 5

## 2018-06-12 MED ORDER — SODIUM CHLORIDE 0.9 % IV SOLN
Freq: Once | INTRAVENOUS | Status: AC
  Administered 2018-06-12: 09:00:00 via INTRAVENOUS

## 2018-06-12 MED ORDER — DIPHENHYDRAMINE HCL 50 MG/ML IJ SOLN
INTRAMUSCULAR | Status: AC
  Filled 2018-06-12: qty 1

## 2018-06-12 MED ORDER — SODIUM CHLORIDE 0.9 % IV SOLN
97.0000 mg/m2 | Freq: Once | INTRAVENOUS | Status: AC
  Administered 2018-06-12: 200 mg via INTRAVENOUS
  Filled 2018-06-12: qty 10

## 2018-06-12 MED ORDER — POTASSIUM CHLORIDE 2 MEQ/ML IV SOLN
Freq: Once | INTRAVENOUS | Status: AC
  Administered 2018-06-12: 10:00:00 via INTRAVENOUS
  Filled 2018-06-12: qty 10

## 2018-06-12 MED ORDER — FAMOTIDINE IN NACL 20-0.9 MG/50ML-% IV SOLN
40.0000 mg | Freq: Once | INTRAVENOUS | Status: AC
  Administered 2018-06-12: 40 mg via INTRAVENOUS

## 2018-06-12 MED ORDER — TRIAMCINOLONE ACETONIDE 0.1 % EX CREA: 1.0000 "application " | TOPICAL_CREAM | Freq: Two times a day (BID) | CUTANEOUS | 1 refills | Status: DC

## 2018-06-12 MED ORDER — PALONOSETRON HCL INJECTION 0.25 MG/5ML
INTRAVENOUS | Status: AC
  Filled 2018-06-12: qty 5

## 2018-06-12 MED ORDER — DIPHENHYDRAMINE HCL 50 MG/ML IJ SOLN
50.0000 mg | Freq: Once | INTRAMUSCULAR | Status: AC
  Administered 2018-06-12: 50 mg via INTRAVENOUS

## 2018-06-12 NOTE — Patient Instructions (Signed)
[place] Discharge Instructions for Patients Receiving Chemotherapy  Today you received the following chemotherapy agents: Cisplatin and Etoposide.  To help prevent nausea and vomiting after your treatment, we encourage you to take your nausea medication as prescribed.   If you develop nausea and vomiting that is not controlled by your nausea medication, call the clinic.   BELOW ARE SYMPTOMS THAT SHOULD BE REPORTED IMMEDIATELY:  *FEVER GREATER THAN 100.5 F  *CHILLS WITH OR WITHOUT FEVER  NAUSEA AND VOMITING THAT IS NOT CONTROLLED WITH YOUR NAUSEA MEDICATION  *UNUSUAL SHORTNESS OF BREATH  *UNUSUAL BRUISING OR BLEEDING  TENDERNESS IN MOUTH AND THROAT WITH OR WITHOUT PRESENCE OF ULCERS  *URINARY PROBLEMS  *BOWEL PROBLEMS  UNUSUAL RASH Items with * indicate a potential emergency and should be followed up as soon as possible.  Feel free to call the clinic should you have any questions or concerns. The clinic phone number is [phone].  Please show the [place] at check-in to the Emergency Department and triage nurse.    DO NOT USE PRESS AND SEAL ON YOUR PORT SITE ANYMORE. Try using saran wrap instead. :) It is still okay to use the numbing cream on your port site!  There is no form to take for the PFT (Pulmonary Function Test) For 4 hours before the test no caffeine, inhalers or tobacco.

## 2018-06-12 NOTE — Progress Notes (Signed)
The patient was seen in the infusion room while he was receiving chemotherapy today.  He was noted to have a rash over the right chest wall and an area that corresponded to an occlusive dressing over his port and EMLA cream.  The patient has been using a press and seal kitchen wrap to cover his port site after placing on EMLA cream.  The patient's family was instructed to stop using the press and seal kitchen ramp and to use a plain plastic wrap to cover the area.  The patient's family expresses understanding and agreement with this plan.  Sandi Mealy, MHS, PA-C Physician Assistant

## 2018-06-12 NOTE — Progress Notes (Signed)
Per MD [name] we may run post hydration fluids concurrently with Cisplatin for EVERY treatment - so the last 2 hours of IVF will be with 1 hour Cisplat and 1 hour by itself.

## 2018-06-12 NOTE — Progress Notes (Signed)
At 1208 RN noted pt with a tremor and flushed face neck and chest; no other s/s, pt stated he felt "warm and shaky." Cisplatin pre-hydration complete and pt was receiving NS at the time. Lucianne Lei, PA to tx area to see pt. Will continue to monitor. VSS; see vital flowsheet at 1209.

## 2018-06-13 ENCOUNTER — Inpatient Hospital Stay: Payer: BLUE CROSS/BLUE SHIELD

## 2018-06-13 VITALS — BP 123/84 | HR 79 | Temp 97.9°F | Resp 18

## 2018-06-13 DIAGNOSIS — C6290 Malignant neoplasm of unspecified testis, unspecified whether descended or undescended: Secondary | ICD-10-CM

## 2018-06-13 DIAGNOSIS — C6291 Malignant neoplasm of right testis, unspecified whether descended or undescended: Secondary | ICD-10-CM | POA: Diagnosis not present

## 2018-06-13 MED ORDER — SODIUM CHLORIDE 0.9 % IV SOLN
Freq: Once | INTRAVENOUS | Status: AC
  Administered 2018-06-13: 09:00:00 via INTRAVENOUS

## 2018-06-13 MED ORDER — SODIUM CHLORIDE 0.9 % IV SOLN
20.0000 mg | Freq: Once | INTRAVENOUS | Status: AC
  Administered 2018-06-13: 20 mg via INTRAVENOUS
  Filled 2018-06-13: qty 2

## 2018-06-13 MED ORDER — SODIUM CHLORIDE 0.9% FLUSH
10.0000 mL | INTRAVENOUS | Status: DC | PRN
  Administered 2018-06-13: 10 mL
  Filled 2018-06-13: qty 10

## 2018-06-13 MED ORDER — FAMOTIDINE IN NACL 20-0.9 MG/50ML-% IV SOLN
40.0000 mg | Freq: Once | INTRAVENOUS | Status: AC
  Administered 2018-06-13: 40 mg via INTRAVENOUS

## 2018-06-13 MED ORDER — SODIUM CHLORIDE 0.9 % IV SOLN
200.0000 mg | Freq: Once | INTRAVENOUS | Status: AC
  Administered 2018-06-13: 200 mg via INTRAVENOUS
  Filled 2018-06-13: qty 10

## 2018-06-13 MED ORDER — FAMOTIDINE IN NACL 20-0.9 MG/50ML-% IV SOLN
INTRAVENOUS | Status: AC
Start: 2018-06-13 — End: ?
  Filled 2018-06-13: qty 50

## 2018-06-13 MED ORDER — HEPARIN SOD (PORK) LOCK FLUSH 100 UNIT/ML IV SOLN
500.0000 [IU] | Freq: Once | INTRAVENOUS | Status: AC | PRN
  Administered 2018-06-13: 500 [IU]
  Filled 2018-06-13: qty 5

## 2018-06-13 MED ORDER — FAMOTIDINE IN NACL 20-0.9 MG/50ML-% IV SOLN
INTRAVENOUS | Status: AC
  Filled 2018-06-13: qty 50

## 2018-06-13 MED ORDER — SODIUM CHLORIDE 0.9 % IV SOLN
20.0000 mg/m2 | Freq: Once | INTRAVENOUS | Status: AC
  Administered 2018-06-13: 41 mg via INTRAVENOUS
  Filled 2018-06-13: qty 41

## 2018-06-13 MED ORDER — DIPHENHYDRAMINE HCL 50 MG/ML IJ SOLN
INTRAMUSCULAR | Status: AC
  Filled 2018-06-13: qty 1

## 2018-06-13 MED ORDER — DIPHENHYDRAMINE HCL 50 MG/ML IJ SOLN
50.0000 mg | Freq: Once | INTRAMUSCULAR | Status: AC
Start: 2018-06-13 — End: 2018-06-13
  Administered 2018-06-13: 50 mg via INTRAVENOUS

## 2018-06-13 MED ORDER — POTASSIUM CHLORIDE 2 MEQ/ML IV SOLN
Freq: Once | INTRAVENOUS | Status: AC
  Administered 2018-06-13: 09:00:00 via INTRAVENOUS
  Filled 2018-06-13: qty 10

## 2018-06-13 NOTE — Patient Instructions (Signed)
[place] Discharge Instructions for Patients Receiving Chemotherapy  Today you received the following chemotherapy agents: Cisplatin and Etoposide.  To help prevent nausea and vomiting after your treatment, we encourage you to take your nausea medication as prescribed.   If you develop nausea and vomiting that is not controlled by your nausea medication, call the clinic.   BELOW ARE SYMPTOMS THAT SHOULD BE REPORTED IMMEDIATELY:  *FEVER GREATER THAN 100.5 F  *CHILLS WITH OR WITHOUT FEVER  NAUSEA AND VOMITING THAT IS NOT CONTROLLED WITH YOUR NAUSEA MEDICATION  *UNUSUAL SHORTNESS OF BREATH  *UNUSUAL BRUISING OR BLEEDING  TENDERNESS IN MOUTH AND THROAT WITH OR WITHOUT PRESENCE OF ULCERS  *URINARY PROBLEMS  *BOWEL PROBLEMS  UNUSUAL RASH Items with * indicate a potential emergency and should be followed up as soon as possible.  Feel free to call the clinic should you have any questions or concerns. The clinic phone number is [phone].  Please show the [place] at check-in to the Emergency Department and triage nurse.    DO NOT USE PRESS AND SEAL ON YOUR PORT SITE ANYMORE. Try using saran wrap instead. :) It is still okay to use the numbing cream on your port site!  There is no form to take for the PFT (Pulmonary Function Test) For 4 hours before the test no caffeine, inhalers or tobacco.

## 2018-06-13 NOTE — Progress Notes (Signed)
Pt with red rash around area where port dressing is placed.  See progress note from 06/11/18.  Pt's father states rash looks better since starting a steroid cream last evening (06/12/18).  Irritation in area is still evident, but is restricted to the area where the port dressing is placed. Used a skin sensitive dressing today when accessing port.  Pt VSS, no other complaints. Will continue to follow.

## 2018-06-14 ENCOUNTER — Inpatient Hospital Stay (HOSPITAL_BASED_OUTPATIENT_CLINIC_OR_DEPARTMENT_OTHER): Payer: BLUE CROSS/BLUE SHIELD | Admitting: Medical

## 2018-06-14 ENCOUNTER — Inpatient Hospital Stay: Payer: BLUE CROSS/BLUE SHIELD

## 2018-06-14 VITALS — BP 124/87 | HR 78 | Temp 98.6°F | Resp 18

## 2018-06-14 DIAGNOSIS — R519 Headache, unspecified: Secondary | ICD-10-CM

## 2018-06-14 DIAGNOSIS — C6290 Malignant neoplasm of unspecified testis, unspecified whether descended or undescended: Secondary | ICD-10-CM

## 2018-06-14 DIAGNOSIS — C6291 Malignant neoplasm of right testis, unspecified whether descended or undescended: Secondary | ICD-10-CM | POA: Diagnosis not present

## 2018-06-14 DIAGNOSIS — R51 Headache: Secondary | ICD-10-CM

## 2018-06-14 MED ORDER — FAMOTIDINE IN NACL 20-0.9 MG/50ML-% IV SOLN
40.0000 mg | Freq: Once | INTRAVENOUS | Status: AC
  Administered 2018-06-14: 40 mg via INTRAVENOUS

## 2018-06-14 MED ORDER — PALONOSETRON HCL INJECTION 0.25 MG/5ML
0.2500 mg | Freq: Once | INTRAVENOUS | Status: AC
  Administered 2018-06-14: 0.25 mg via INTRAVENOUS

## 2018-06-14 MED ORDER — FAMOTIDINE IN NACL 20-0.9 MG/50ML-% IV SOLN
INTRAVENOUS | Status: AC
Start: 2018-06-14 — End: ?
  Filled 2018-06-14: qty 50

## 2018-06-14 MED ORDER — SODIUM CHLORIDE 0.9 % IV SOLN
Freq: Once | INTRAVENOUS | Status: AC
  Administered 2018-06-14: 09:00:00 via INTRAVENOUS

## 2018-06-14 MED ORDER — DIPHENHYDRAMINE HCL 50 MG/ML IJ SOLN
INTRAMUSCULAR | Status: AC
Start: 2018-06-14 — End: ?
  Filled 2018-06-14: qty 1

## 2018-06-14 MED ORDER — POTASSIUM CHLORIDE 2 MEQ/ML IV SOLN
Freq: Once | INTRAVENOUS | Status: AC
  Administered 2018-06-14: 09:00:00 via INTRAVENOUS
  Filled 2018-06-14: qty 10

## 2018-06-14 MED ORDER — HEPARIN SOD (PORK) LOCK FLUSH 100 UNIT/ML IV SOLN
500.0000 [IU] | Freq: Once | INTRAVENOUS | Status: AC | PRN
  Administered 2018-06-14: 500 [IU]
  Filled 2018-06-14: qty 5

## 2018-06-14 MED ORDER — PALONOSETRON HCL INJECTION 0.25 MG/5ML
INTRAVENOUS | Status: AC
Start: 2018-06-14 — End: ?
  Filled 2018-06-14: qty 5

## 2018-06-14 MED ORDER — SODIUM CHLORIDE 0.9 % IV SOLN
20.0000 mg/m2 | Freq: Once | INTRAVENOUS | Status: AC
  Administered 2018-06-14: 41 mg via INTRAVENOUS
  Filled 2018-06-14: qty 41

## 2018-06-14 MED ORDER — FOSAPREPITANT DIMEGLUMINE INJECTION 150 MG
Freq: Once | INTRAVENOUS | Status: AC
  Administered 2018-06-14: 12:00:00 via INTRAVENOUS
  Filled 2018-06-14: qty 5

## 2018-06-14 MED ORDER — SODIUM CHLORIDE 0.9% FLUSH
10.0000 mL | INTRAVENOUS | Status: DC | PRN
  Administered 2018-06-14: 10 mL
  Filled 2018-06-14: qty 10

## 2018-06-14 MED ORDER — FAMOTIDINE IN NACL 20-0.9 MG/50ML-% IV SOLN
INTRAVENOUS | Status: AC
  Filled 2018-06-14: qty 50

## 2018-06-14 MED ORDER — DIPHENHYDRAMINE HCL 50 MG/ML IJ SOLN
50.0000 mg | Freq: Once | INTRAMUSCULAR | Status: AC
  Administered 2018-06-14: 50 mg via INTRAVENOUS

## 2018-06-14 MED ORDER — SODIUM CHLORIDE 0.9 % IV SOLN
99.0000 mg/m2 | Freq: Once | INTRAVENOUS | Status: AC
  Administered 2018-06-14: 200 mg via INTRAVENOUS
  Filled 2018-06-14: qty 10

## 2018-06-14 NOTE — Progress Notes (Signed)
This provider was asked to see Mr. Greg Peterson while he was in the infusion room receiving treatment today. Mr. Rendell stated that he was having a "feeling" in his head. He could not clearly describe this sensation further. His vital signs were normal. The patient was otherwise without issues of concern. He was told to report any changes to the nurse.  Sandi Mealy, MHS, PA-C Physician Assistant

## 2018-06-14 NOTE — Progress Notes (Signed)
This provider was asked to see Mr. Greg Peterson while he was receiving chemotherapy in the infusion room today.  I was asked to follow-up on the rash that was noted around his right chest wall Port-A-Cath after placing a  self stick dressing of kitchen plastic wrap on the site.  The patient now has a similar area in the left axilla.  He was given a prescription for triamcinolone cream which she will use twice daily.  As we discussed yesterday, the patient will no longer use the self stick kitchen wrap and will use plastic wrap to cover the port after he places EMLA cream they are prior to access.  Sandi Mealy, MHS, PA-C Physician Assistant

## 2018-06-17 ENCOUNTER — Ambulatory Visit (HOSPITAL_COMMUNITY)
Admission: RE | Admit: 2018-06-17 | Discharge: 2018-06-17 | Disposition: A | Discharge status: Home / Self Care | Payer: BLUE CROSS/BLUE SHIELD | Source: Ambulatory Visit | Attending: Oncology | Admitting: Oncology

## 2018-06-17 DIAGNOSIS — C6290 Malignant neoplasm of unspecified testis, unspecified whether descended or undescended: Secondary | ICD-10-CM | POA: Diagnosis not present

## 2018-06-17 LAB — PULMONARY FUNCTION TEST
DL/VA % PRED: 74 %
DL/VA: 3.53 ml/min/mmHg/L
DLCO UNC % PRED: 79 %
DLCO cor % pred: 78 %
DLCO cor: 26.41 ml/min/mmHg
DLCO unc: 26.77 ml/min/mmHg
FEF 25-75 Pre: 5.08 L/sec
FEF2575-%Pred-Pre: 103 %
FEV1-%PRED-PRE: 107 %
FEV1-Pre: 5.09 L
FEV1FVC-%PRED-PRE: 99 %
FEV6-%Pred-Pre: 108 %
FEV6-Pre: 6.17 L
FEV6FVC-%Pred-Pre: 101 %
FVC-%Pred-Pre: 107 %
FVC-PRE: 6.17 L
PRE FEV1/FVC RATIO: 83 %
Pre FEV6/FVC Ratio: 100 %

## 2018-06-18 ENCOUNTER — Emergency Department (HOSPITAL_COMMUNITY): Payer: BLUE CROSS/BLUE SHIELD

## 2018-06-18 ENCOUNTER — Inpatient Hospital Stay (HOSPITAL_COMMUNITY)
Admission: EM | Admit: 2018-06-18 | Discharge: 2018-06-19 | DRG: 864 | Discharge status: Against Medical Advice | Payer: BLUE CROSS/BLUE SHIELD | Attending: Internal Medicine | Admitting: Internal Medicine

## 2018-06-18 ENCOUNTER — Encounter (HOSPITAL_COMMUNITY): Payer: Self-pay

## 2018-06-18 ENCOUNTER — Inpatient Hospital Stay (HOSPITAL_BASED_OUTPATIENT_CLINIC_OR_DEPARTMENT_OTHER): Payer: BLUE CROSS/BLUE SHIELD | Admitting: Oncology

## 2018-06-18 ENCOUNTER — Inpatient Hospital Stay: Payer: BLUE CROSS/BLUE SHIELD

## 2018-06-18 ENCOUNTER — Inpatient Hospital Stay (HOSPITAL_BASED_OUTPATIENT_CLINIC_OR_DEPARTMENT_OTHER): Payer: BLUE CROSS/BLUE SHIELD | Admitting: Medical

## 2018-06-18 ENCOUNTER — Telehealth: Payer: Self-pay

## 2018-06-18 VITALS — BP 100/80 | HR 104 | Temp 97.6°F | Resp 18 | Ht 71.0 in | Wt 177.4 lb

## 2018-06-18 VITALS — HR 99

## 2018-06-18 DIAGNOSIS — Z95828 Presence of other vascular implants and grafts: Secondary | ICD-10-CM

## 2018-06-18 DIAGNOSIS — R51 Headache: Principal | ICD-10-CM

## 2018-06-18 DIAGNOSIS — R112 Nausea with vomiting, unspecified: Secondary | ICD-10-CM

## 2018-06-18 DIAGNOSIS — R740 Nonspecific elevation of levels of transaminase and lactic acid dehydrogenase [LDH]: Secondary | ICD-10-CM | POA: Diagnosis present

## 2018-06-18 DIAGNOSIS — E871 Hypo-osmolality and hyponatremia: Secondary | ICD-10-CM

## 2018-06-18 DIAGNOSIS — C6291 Malignant neoplasm of right testis, unspecified whether descended or undescended: Secondary | ICD-10-CM

## 2018-06-18 DIAGNOSIS — C6290 Malignant neoplasm of unspecified testis, unspecified whether descended or undescended: Secondary | ICD-10-CM

## 2018-06-18 DIAGNOSIS — T451X5A Adverse effect of antineoplastic and immunosuppressive drugs, initial encounter: Secondary | ICD-10-CM | POA: Diagnosis not present

## 2018-06-18 DIAGNOSIS — K649 Unspecified hemorrhoids: Secondary | ICD-10-CM | POA: Diagnosis not present

## 2018-06-18 DIAGNOSIS — F840 Autistic disorder: Secondary | ICD-10-CM

## 2018-06-18 DIAGNOSIS — Z9079 Acquired absence of other genital organ(s): Secondary | ICD-10-CM

## 2018-06-18 DIAGNOSIS — D696 Thrombocytopenia, unspecified: Secondary | ICD-10-CM

## 2018-06-18 DIAGNOSIS — R519 Headache, unspecified: Secondary | ICD-10-CM

## 2018-06-18 DIAGNOSIS — Z5111 Encounter for antineoplastic chemotherapy: Secondary | ICD-10-CM | POA: Diagnosis present

## 2018-06-18 DIAGNOSIS — R21 Rash and other nonspecific skin eruption: Secondary | ICD-10-CM | POA: Diagnosis not present

## 2018-06-18 DIAGNOSIS — R1013 Epigastric pain: Secondary | ICD-10-CM | POA: Diagnosis not present

## 2018-06-18 DIAGNOSIS — R6510 Systemic inflammatory response syndrome (SIRS) of non-infectious origin without acute organ dysfunction: Secondary | ICD-10-CM | POA: Diagnosis not present

## 2018-06-18 DIAGNOSIS — R509 Fever, unspecified: Secondary | ICD-10-CM | POA: Diagnosis not present

## 2018-06-18 DIAGNOSIS — Z4889 Encounter for other specified surgical aftercare: Secondary | ICD-10-CM

## 2018-06-18 DIAGNOSIS — Z79899 Other long term (current) drug therapy: Secondary | ICD-10-CM

## 2018-06-18 DIAGNOSIS — R739 Hyperglycemia, unspecified: Secondary | ICD-10-CM

## 2018-06-18 DIAGNOSIS — R7401 Elevation of levels of liver transaminase levels: Secondary | ICD-10-CM

## 2018-06-18 DIAGNOSIS — R17 Unspecified jaundice: Secondary | ICD-10-CM | POA: Diagnosis present

## 2018-06-18 DIAGNOSIS — I959 Hypotension, unspecified: Secondary | ICD-10-CM | POA: Diagnosis present

## 2018-06-18 HISTORY — DX: Malignant (primary) neoplasm, unspecified: C80.1

## 2018-06-18 LAB — CBC WITH DIFFERENTIAL (CANCER CENTER ONLY)
Basophils Absolute: 0 10*3/uL (ref 0.0–0.1)
Basophils Relative: 0 %
EOS ABS: 0 10*3/uL (ref 0.0–0.5)
Eosinophils Relative: 0 %
HCT: 43.6 % (ref 38.4–49.9)
HEMOGLOBIN: 15.5 g/dL (ref 13.0–17.1)
LYMPHS ABS: 1 10*3/uL (ref 0.9–3.3)
LYMPHS PCT: 22 %
MCH: 29.6 pg (ref 27.2–33.4)
MCHC: 35.6 g/dL (ref 32.0–36.0)
MCV: 83.2 fL (ref 79.3–98.0)
MONOS PCT: 2 %
Monocytes Absolute: 0.1 10*3/uL (ref 0.1–0.9)
NEUTROS PCT: 76 %
Neutro Abs: 3.5 10*3/uL (ref 1.5–6.5)
Platelet Count: 169 10*3/uL (ref 140–400)
RBC: 5.24 MIL/uL (ref 4.20–5.82)
RDW: 13 % (ref 11.0–14.6)
WBC Count: 4.5 10*3/uL (ref 4.0–10.3)

## 2018-06-18 LAB — CMP (CANCER CENTER ONLY)
ALT: 48 U/L — AB (ref 0–44)
AST: 26 U/L (ref 15–41)
Albumin: 4.1 g/dL (ref 3.5–5.0)
Alkaline Phosphatase: 79 U/L (ref 38–126)
Anion gap: 8 (ref 5–15)
BUN: 22 mg/dL — AB (ref 6–20)
CO2: 30 mmol/L (ref 22–32)
CREATININE: 1.13 mg/dL (ref 0.61–1.24)
Calcium: 9.5 mg/dL (ref 8.9–10.3)
Chloride: 98 mmol/L (ref 98–111)
GFR, Est AFR Am: >60 mL/min (ref >60)
GFR, Estimated: >60 mL/min (ref >60)
Glucose, Bld: 89 mg/dL (ref 70–99)
Potassium: 4.5 mmol/L (ref 3.5–5.1)
SODIUM: 136 mmol/L (ref 135–145)
Total Bilirubin: 0.8 mg/dL (ref 0.3–1.2)
Total Protein: 7.4 g/dL (ref 6.5–8.1)

## 2018-06-18 MED ORDER — ONDANSETRON HCL 4 MG/2ML IJ SOLN
4.0000 mg | Freq: Once | INTRAMUSCULAR | Status: AC
  Administered 2018-06-19: 4 mg via INTRAVENOUS
  Filled 2018-06-18: qty 2

## 2018-06-18 MED ORDER — SODIUM CHLORIDE 0.9% FLUSH
10.0000 mL | Freq: Once | INTRAVENOUS | Status: AC
  Administered 2018-06-18: 10 mL
  Filled 2018-06-18: qty 10

## 2018-06-18 MED ORDER — SODIUM CHLORIDE 0.9% FLUSH
10.0000 mL | INTRAVENOUS | Status: DC | PRN
  Administered 2018-06-18: 10 mL
  Filled 2018-06-18: qty 10

## 2018-06-18 MED ORDER — PROCHLORPERAZINE MALEATE 10 MG PO TABS
ORAL_TABLET | ORAL | Status: AC
  Filled 2018-06-18: qty 1

## 2018-06-18 MED ORDER — HEPARIN SOD (PORK) LOCK FLUSH 100 UNIT/ML IV SOLN
500.0000 [IU] | Freq: Once | INTRAVENOUS | Status: AC | PRN
  Administered 2018-06-18: 500 [IU]
  Filled 2018-06-18: qty 5

## 2018-06-18 MED ORDER — SODIUM CHLORIDE 0.9 % IV BOLUS
1000.0000 mL | Freq: Once | INTRAVENOUS | Status: AC
  Administered 2018-06-19: 1000 mL via INTRAVENOUS

## 2018-06-18 MED ORDER — SODIUM CHLORIDE 0.9 % IV SOLN
Freq: Once | INTRAVENOUS | Status: AC
  Administered 2018-06-18: 14:00:00 via INTRAVENOUS

## 2018-06-18 MED ORDER — PROCHLORPERAZINE MALEATE 10 MG PO TABS
10.0000 mg | ORAL_TABLET | Freq: Once | ORAL | Status: AC
  Administered 2018-06-18: 10 mg via ORAL

## 2018-06-18 MED ORDER — SODIUM CHLORIDE 0.9 % IV BOLUS
1000.0000 mL | Freq: Once | INTRAVENOUS | Status: AC
Start: 2018-06-19 — End: 2018-06-19
  Administered 2018-06-19: 1000 mL via INTRAVENOUS

## 2018-06-18 MED ORDER — SODIUM CHLORIDE 0.9 % IV SOLN
30.0000 [IU] | Freq: Once | INTRAVENOUS | Status: AC
  Administered 2018-06-18: 30 [IU] via INTRAVENOUS
  Filled 2018-06-18: qty 10

## 2018-06-18 NOTE — Patient Instructions (Signed)
[facility] Discharge Instructions for Patients Receiving Chemotherapy  Today you received the following chemotherapy agents Bleomycin  To help prevent nausea and vomiting after your treatment, we encourage you to take your nausea medication as directed.   If you develop nausea and vomiting that is not controlled by your nausea medication, call the clinic.   BELOW ARE SYMPTOMS THAT SHOULD BE REPORTED IMMEDIATELY:  *FEVER GREATER THAN 100.5 F  *CHILLS WITH OR WITHOUT FEVER  NAUSEA AND VOMITING THAT IS NOT CONTROLLED WITH YOUR NAUSEA MEDICATION  *UNUSUAL SHORTNESS OF BREATH  *UNUSUAL BRUISING OR BLEEDING  TENDERNESS IN MOUTH AND THROAT WITH OR WITHOUT PRESENCE OF ULCERS  *URINARY PROBLEMS  *BOWEL PROBLEMS  UNUSUAL RASH Items with * indicate a potential emergency and should be followed up as soon as possible.  Feel free to call the clinic should you have any questions or concerns. The clinic phone number is [phone].  Please show the CHEMO ALERT CARD at check-in to the Emergency Department and triage nurse.   

## 2018-06-18 NOTE — ED Triage Notes (Signed)
Pt complains of a fever, tachycardia and nausea since this afternoon Pt had a chemo treatment earlier today

## 2018-06-18 NOTE — Telephone Encounter (Signed)
Printed avs and calender of upcoming appointment. Per 7/16 los 

## 2018-06-18 NOTE — Progress Notes (Signed)
Hematology and Oncology Follow Up Visit  Greg Peterson [id number] 07/22/1996 22 y.o. 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  He presented with retroperitoneal adenopathy in May 2019 after presenting with stage I disease initially.  Prior Therapy:  He is status post orchiectomy completed on January 04, 2018.  The final pathology showed pure seminoma with lymphovascular invasion.  He developed recurrent disease with 3.2 cm periaortic lymphadenopathy and elevated hCG of 121 and LDH of 367  Current therapy: BEP chemotherapy started on 05/20/2018.  He is here for day 9 of cycle 2 of therapy.  Interim History: Mr. Goeller presents today for a follow-up visit.  He completed cycle 2 of chemotherapy after developing initial infusion related complication to etoposide.  His etoposide infusion was interrupted on day 1 but resumed day 2 today for without any interruption.  Etoposide was given with a longer infusion time which she has tolerated better.  He denies any residual complications to chemotherapy today.  He denies any nausea, vomiting or peripheral neuropathy.  He does report some back discomfort associated with laying for an extended period of time.  He does not report any headaches, blurry vision, syncope or seizures.  He denies any change in his mood or depression.  Does not report any fevers, chills or sweats.  He denies any changes appetite or weight. Does not report any cough, wheezing or hemoptysis.  Does not report any chest pain, palpitation, orthopnea or leg edema.    He denies any dyspnea on exertion or shortness of breath.  Does not report any nausea, vomiting or abdominal pain.  Does not report any hematochezia or melena.   Does not report any early satiety or abdominal distention.  Does not report any paralysis or myalgias.  Does not report frequency, urgency or hematuria.   Does not report any skin rashes or lesions. Does not report any heat or cold intolerance.  He denies any bleeding or clotting tendencies.  Remaining review of systems is negative.    Medications: I have reviewed the patient's current medications.  Current Outpatient Medications  Medication Sig Dispense Refill  . citalopram (CELEXA) 10 MG tablet Take 10 mg by mouth daily.    Marland Kitchen HYDROcodone-acetaminophen (NORCO) 5-325 MG tablet Take 1 tablet by mouth every 6 (six) hours as needed for moderate pain. (Patient not taking: Reported on 01/30/2018) 6 tablet 0  . hydrocortisone (ANUSOL-HC) 2.5 % rectal cream Place 1 application rectally 2 (two) times daily. (Patient not taking: Reported on 06/10/2018) 30 g 2  . ibuprofen (ADVIL,MOTRIN) 200 MG tablet Take 200 mg by mouth every 6 (six) hours as needed.    . lidocaine-prilocaine (EMLA) cream Apply 1 application topically as needed. Apply to port area 1-2 hours before coming to [facility] for treatment 30 g 1  . prochlorperazine (COMPAZINE) 10 MG tablet Take 1 tablet (10 mg total) by mouth every 6 (six) hours as needed for nausea or vomiting. (Patient not taking: Reported on 05/28/2018) 30 tablet 0  . triamcinolone cream (KENALOG) 0.1 % Apply 1 application topically 2 (two) times daily. Apply to rash twice daily until rash resolved. 45 g 1   No current facility-administered medications for this visit.      Allergies: No Known Allergies  Past Medical History, Surgical history, Social history, and Family History remained without any change in updated today.    Physical Exam: Blood  pressure 100/80, pulse (!) 104, temperature 97.6 F (36.4 C), temperature source Oral, resp. rate 18, height 5\' 11"  (1.803 m), weight 177 lb 6.4 oz (80.5 kg), SpO2 98 %.    ECOG: 0 General appearance: Alert, awake gentleman without distress.. Head: Normal cephalic without abnormalities. Oropharynx: No oral thrush or ulcers. Eyes: Nipples are equal and round reactive to  light. Lymph nodes: No cervical, supraclavicular, axillary or supraclavicular lymphadenopathy. Heart:regular rate and rhythm, without any murmurs or gallops. Lung: clear in all lung fields without any wheezes or dullness to percussion. Abdomin: soft, nontender without any rebound or guarding.  No shifting dullness or ascites. Neurological: No deficits noted motor or sensory and deep tendon reflexes. Skin: No ecchymosis or petechiae. Musculoskeletal: No joint deformity or effusion. Psychiatric: Mood and affect appears appropriate.    Lab Results: Lab Results  Component Value Date   WBC 5.2 06/10/2018   HGB 15.1 06/10/2018   HCT 43.7 06/10/2018   MCV 84.8 06/10/2018   PLT 311 06/10/2018     Chemistry      Component Value Date/Time   NA 141 06/10/2018 0815   K 4.2 06/10/2018 0815   CL 107 06/10/2018 0815   CO2 27 06/10/2018 0815   BUN 19 06/10/2018 0815   CREATININE 1.13 06/10/2018 0815      Component Value Date/Time   CALCIUM 9.9 06/10/2018 0815   ALKPHOS 85 06/10/2018 0815   AST 19 06/10/2018 0815   ALT 38 06/10/2018 0815   BILITOT 0.4 06/10/2018 0815       Impression and Plan:   22 year old man with the following issues:  1.  Stage IIC seminoma with retroperitoneal adenopathy documented in May 2019.  He is completing cycle 2 of BEP chemotherapy with few complications.  Risks and benefits of continuing systemic chemotherapy was reviewed today.  He feels reasonably well and willing to proceed.  The plan is to complete 3 cycles of BEP chemotherapy and repeat imaging studies afterwards.  2.  IV access: Port-A-Cath is without any issues.  3.  Antiemetics: Compazine is successful and treating his nausea and vomiting.  4.  Renal function surveillance: Creatinine clearance remains adequate at this time.  5.  Pulmonary function assessment: Repeat DLCO showed stable pulmonary function test.  Risks and benefits of proceeding with bleomycin was discussed today.  He will  resume bleomycin as scheduled and will continue to monitor his pulmonary function test and repeat lab prior to cycle 3 of therapy.  6.  Prognosis: Anticipate full recovery from his cancer and the chances of cure is very high at this time.  7.  Infusion related complications: Managed with decreasing the infusion rate of etoposide.  8.  Follow-up: We will be on July 29 before the start of cycle 3 of therapy after completing cycle 2 with bleomycin on June 25, 2018.  25  minutes was spent with the patient face-to-face today.  More than 50% of time was dedicated to patient counseling, education and coordinating his multifaceted care including dressing issues related to systemic chemotherapy.    Zola Button, MD 7/16/20191:34 PM

## 2018-06-19 ENCOUNTER — Emergency Department (HOSPITAL_COMMUNITY): Payer: BLUE CROSS/BLUE SHIELD

## 2018-06-19 ENCOUNTER — Other Ambulatory Visit: Payer: Self-pay

## 2018-06-19 DIAGNOSIS — R7401 Elevation of levels of liver transaminase levels: Secondary | ICD-10-CM

## 2018-06-19 DIAGNOSIS — R17 Unspecified jaundice: Secondary | ICD-10-CM | POA: Diagnosis present

## 2018-06-19 DIAGNOSIS — R740 Nonspecific elevation of levels of transaminase and lactic acid dehydrogenase [LDH]: Secondary | ICD-10-CM

## 2018-06-19 DIAGNOSIS — R509 Fever, unspecified: Secondary | ICD-10-CM | POA: Diagnosis present

## 2018-06-19 DIAGNOSIS — I959 Hypotension, unspecified: Secondary | ICD-10-CM | POA: Diagnosis present

## 2018-06-19 DIAGNOSIS — R6510 Systemic inflammatory response syndrome (SIRS) of non-infectious origin without acute organ dysfunction: Secondary | ICD-10-CM | POA: Diagnosis present

## 2018-06-19 DIAGNOSIS — C6290 Malignant neoplasm of unspecified testis, unspecified whether descended or undescended: Secondary | ICD-10-CM | POA: Diagnosis present

## 2018-06-19 DIAGNOSIS — R739 Hyperglycemia, unspecified: Secondary | ICD-10-CM | POA: Diagnosis present

## 2018-06-19 DIAGNOSIS — F840 Autistic disorder: Secondary | ICD-10-CM

## 2018-06-19 DIAGNOSIS — E871 Hypo-osmolality and hyponatremia: Secondary | ICD-10-CM | POA: Diagnosis present

## 2018-06-19 DIAGNOSIS — T451X5A Adverse effect of antineoplastic and immunosuppressive drugs, initial encounter: Secondary | ICD-10-CM | POA: Diagnosis present

## 2018-06-19 DIAGNOSIS — D696 Thrombocytopenia, unspecified: Secondary | ICD-10-CM | POA: Diagnosis present

## 2018-06-19 DIAGNOSIS — Z79899 Other long term (current) drug therapy: Secondary | ICD-10-CM | POA: Diagnosis not present

## 2018-06-19 DIAGNOSIS — R112 Nausea with vomiting, unspecified: Secondary | ICD-10-CM | POA: Diagnosis not present

## 2018-06-19 LAB — COMPREHENSIVE METABOLIC PANEL
ALBUMIN: 3.8 g/dL (ref 3.5–5.0)
ALT: 47 U/L — AB (ref 0–44)
AST: 30 U/L (ref 15–41)
Alkaline Phosphatase: 63 U/L (ref 38–126)
Anion gap: 11 (ref 5–15)
BUN: 26 mg/dL — AB (ref 6–20)
CHLORIDE: 96 mmol/L — AB (ref 98–111)
CO2: 27 mmol/L (ref 22–32)
CREATININE: 1.24 mg/dL (ref 0.61–1.24)
Calcium: 8.8 mg/dL — ABNORMAL LOW (ref 8.9–10.3)
GFR calc non Af Amer: >60 mL/min (ref >60)
GLUCOSE: 127 mg/dL — AB (ref 70–99)
Potassium: 3.5 mmol/L (ref 3.5–5.1)
SODIUM: 134 mmol/L — AB (ref 135–145)
Total Bilirubin: 1.4 mg/dL — ABNORMAL HIGH (ref 0.3–1.2)
Total Protein: 6.9 g/dL (ref 6.5–8.1)

## 2018-06-19 LAB — CBC WITH DIFFERENTIAL/PLATELET
Basophils Absolute: 0 10*3/uL (ref 0.0–0.1)
Basophils Relative: 0 %
EOS ABS: 0 10*3/uL (ref 0.0–0.7)
Eosinophils Relative: 0 %
HCT: 40.8 % (ref 39.0–52.0)
HEMOGLOBIN: 14.5 g/dL (ref 13.0–17.0)
LYMPHS ABS: 0.3 10*3/uL — AB (ref 0.7–4.0)
Lymphocytes Relative: 2 %
MCH: 29.5 pg (ref 26.0–34.0)
MCHC: 35.5 g/dL (ref 30.0–36.0)
MCV: 83.1 fL (ref 78.0–100.0)
MONO ABS: 0.2 10*3/uL (ref 0.1–1.0)
MONOS PCT: 1 %
NEUTROS PCT: 97 %
Neutro Abs: 11.1 10*3/uL — ABNORMAL HIGH (ref 1.7–7.7)
Platelets: 138 10*3/uL — ABNORMAL LOW (ref 150–400)
RBC: 4.91 MIL/uL (ref 4.22–5.81)
RDW: 12.8 % (ref 11.5–15.5)
WBC: 11.6 10*3/uL — ABNORMAL HIGH (ref 4.0–10.5)

## 2018-06-19 LAB — URINALYSIS, ROUTINE W REFLEX MICROSCOPIC
BILIRUBIN URINE: NEGATIVE
GLUCOSE, UA: NEGATIVE mg/dL
HGB URINE DIPSTICK: NEGATIVE
KETONES UR: NEGATIVE mg/dL
LEUKOCYTES UA: NEGATIVE
Nitrite: NEGATIVE
PROTEIN: NEGATIVE mg/dL
Specific Gravity, Urine: 1.017 (ref 1.005–1.030)
pH: 6 (ref 5.0–8.0)

## 2018-06-19 LAB — I-STAT CG4 LACTIC ACID, ED
LACTIC ACID, VENOUS: 1.29 mmol/L (ref 0.5–1.9)
Lactic Acid, Venous: 0.63 mmol/L (ref 0.5–1.9)

## 2018-06-19 MED ORDER — IOPAMIDOL (ISOVUE-300) INJECTION 61%
INTRAVENOUS | Status: AC
  Filled 2018-06-19: qty 100

## 2018-06-19 MED ORDER — IOPAMIDOL (ISOVUE-300) INJECTION 61%
100.0000 mL | Freq: Once | INTRAVENOUS | Status: AC | PRN
  Administered 2018-06-19: 100 mL via INTRAVENOUS

## 2018-06-19 MED ORDER — SODIUM CHLORIDE 0.9 % IV BOLUS (SEPSIS)
500.0000 mL | Freq: Once | INTRAVENOUS | Status: AC
  Administered 2018-06-19: 500 mL via INTRAVENOUS

## 2018-06-19 MED ORDER — SODIUM CHLORIDE 0.9 % IV SOLN: 2.0000 g | Freq: Three times a day (TID) | INTRAVENOUS | Status: DC

## 2018-06-19 MED ORDER — ONDANSETRON HCL 4 MG/2ML IJ SOLN: 4.0000 mg | Freq: Four times a day (QID) | INTRAMUSCULAR | Status: DC | PRN

## 2018-06-19 MED ORDER — MORPHINE SULFATE (PF) 2 MG/ML IV SOLN: 1.0000 mg | INTRAVENOUS | Status: DC | PRN

## 2018-06-19 MED ORDER — SODIUM CHLORIDE 0.9 % IV SOLN
1500.0000 mg | Freq: Once | INTRAVENOUS | Status: AC
  Administered 2018-06-19: 1500 mg via INTRAVENOUS
  Filled 2018-06-19: qty 1500

## 2018-06-19 MED ORDER — PIPERACILLIN-TAZOBACTAM 3.375 G IVPB 30 MIN
3.3750 g | Freq: Once | INTRAVENOUS | Status: AC
  Administered 2018-06-19: 3.375 g via INTRAVENOUS
  Filled 2018-06-19: qty 50

## 2018-06-19 NOTE — Progress Notes (Signed)
I was asked to see the patient in the infusion room today.  He had a suture along one the lateral edge of the incision site immediately superior to his port that was protruding from the skin.  The suture was grasped with forceps and was pulled from the incision.  The deep portion of the sutures had dissolved.  A knot and the end of the suture was removed without difficulty.  Sandi Mealy, MHS, PA-C Physician Assistant

## 2018-06-19 NOTE — ED Provider Notes (Signed)
[facility] DEPT Provider Note   CSN: [id number] Arrival date & time: 06/18/18  2302     History   Chief Complaint Chief Complaint  Patient presents with  . Fever    HPI Greg Peterson is a 22 y.o. male.  HPI 22 year old Caucasian male past medical history significant for testicular cancer and autism spectrum disorder presents to the emergency department today for evaluation of fever, vomiting, abdominal pain.  Patient received chemotherapy treatment earlier today.  Patient states that since then he has been having nausea and vomiting.  Patient family at bedside states that he had an axillary temperature of 102.  They gave him Motrin prior to arrival.  Patient reports chills.  He also reports some uncomfortable feeling in the right lower quadrant of his abdomen.  Palpation makes the pain worse.  Denies any urinary symptoms.  Denies any diarrhea, melena or hematochezia.  Denies any cough or congestion.  Denies any URI symptoms.  Nothing makes symptoms better.  Pt denies any ha, vision changes, lightheadedness, dizziness, congestion, neck pain, cp, sob, cough,  urinary symptoms, change in bowel habits, melena, hematochezia, lower extremity paresthesias.  Past Medical History:  Diagnosis Date  . Autism spectrum disorder   . Cancer [facility])    testicular    Patient Active Problem List   Diagnosis Date Noted  . Encounter for antineoplastic chemotherapy 06/07/2018  . Port-A-Cath in place 05/28/2018  . Testis cancer ([place]) 05/01/2018    Past Surgical History:  Procedure Laterality Date  . IR FLUORO GUIDE PORT INSERTION RIGHT  05/07/2018  . IR US GUIDE VASC ACCESS RIGHT  05/07/2018  . ORCHIECTOMY Right 01/04/2018   Procedure: RIGHT RADICAL ORCHIECTOMY;  Surgeon: Irine Seal, MD;  Location: [facility];  Service: Urology;  Laterality: Right;        Home Medications    Prior to Admission medications   Medication Sig Start Date End Date  Taking? Authorizing Provider  citalopram (CELEXA) 10 MG tablet Take 10 mg by mouth daily.   Yes [provider]  hydrocortisone (ANUSOL-HC) 2.5 % rectal cream Place 1 application rectally 2 (two) times daily. 05/20/18  Yes Tanner, Lyndon Code., PA-C  ibuprofen (ADVIL,MOTRIN) 200 MG tablet Take 200 mg by mouth every 6 (six) hours as needed for moderate pain.    Yes [provider]  prochlorperazine (COMPAZINE) 10 MG tablet Take 1 tablet (10 mg total) by mouth every 6 (six) hours as needed for nausea or vomiting. 05/01/18  Yes Wyatt Portela, MD  triamcinolone cream (KENALOG) 0.1 % Apply 1 application topically 2 (two) times daily. Apply to rash twice daily until rash resolved. 06/12/18  Yes Tanner, Lyndon Code., PA-C  HYDROcodone-acetaminophen (NORCO) 5-325 MG tablet Take 1 tablet by mouth every 6 (six) hours as needed for moderate pain. 01/04/18   Irine Seal, MD  lidocaine-prilocaine (EMLA) cream Apply 1 application topically as needed. Apply to port area 1-2 hours before coming to [employer] for treatment 05/06/18   Wyatt Portela, MD    Family History History reviewed. No pertinent family history.  Social History Social History   Tobacco Use  . Smoking status: Never Smoker  . Smokeless tobacco: Never Used  Substance Use Topics  . Alcohol use: No    Frequency: Never  . Drug use: No     Allergies   Patient has no known allergies.   Review of Systems Review of Systems  All other systems reviewed and are negative.  Physical Exam Updated Vital Signs BP 100/65   Pulse 76   Temp 98.3 F (36.8 C) (Oral) Comment: pt feels a lot warmer to touch  Resp 14   Ht 5\' 11"  (1.803 m)   Wt 80.5 kg (177 lb 6.4 oz)   SpO2 96%   BMI 24.74 kg/m   Physical Exam  Constitutional: He is oriented to person, place, and time. He appears well-developed and well-nourished.  Non-toxic appearance. No distress.  HENT:  Head: Normocephalic and atraumatic.  Mouth/Throat: Oropharynx is clear  and moist.  Eyes: Pupils are equal, round, and reactive to light. Conjunctivae are normal. Right eye exhibits no discharge. Left eye exhibits no discharge.  Neck: Normal range of motion. Neck supple.  Cardiovascular: Regular rhythm, normal heart sounds and intact distal pulses. Exam reveals no gallop and no friction rub.  No murmur heard. Tachycardia  Pulmonary/Chest: Effort normal and breath sounds normal. No stridor. No respiratory distress. He has no wheezes. He has no rales. He exhibits no tenderness.  Abdominal: Soft. Normal appearance and bowel sounds are normal. He exhibits no distension. There is tenderness in the right lower quadrant. There is no rigidity, no rebound, no guarding and no CVA tenderness.  Musculoskeletal: Normal range of motion. He exhibits no tenderness.  Lymphadenopathy:    He has no cervical adenopathy.  Neurological: He is alert and oriented to person, place, and time.  Skin: Skin is warm and dry. Capillary refill takes less than 2 seconds. No rash noted.  Psychiatric: His behavior is normal. Judgment and thought content normal.  Nursing note and vitals reviewed.    ED Treatments / Results  Labs (all labs ordered are listed, but only abnormal results are displayed) Labs Reviewed  COMPREHENSIVE METABOLIC PANEL - Abnormal; Notable for the following components:      Result Value   Sodium 134 (*)    Chloride 96 (*)    Glucose, Bld 127 (*)    BUN 26 (*)    Calcium 8.8 (*)    ALT 47 (*)    Total Bilirubin 1.4 (*)    All other components within normal limits  CBC WITH DIFFERENTIAL/PLATELET - Abnormal; Notable for the following components:   WBC 11.6 (*)    Platelets 138 (*)    Neutro Abs 11.1 (*)    Lymphs Abs 0.3 (*)    All other components within normal limits  CULTURE, BLOOD (ROUTINE X 2)  CULTURE, BLOOD (ROUTINE X 2)  URINE CULTURE  URINALYSIS, ROUTINE W REFLEX MICROSCOPIC  I-STAT CG4 LACTIC ACID, ED  I-STAT CG4 LACTIC ACID, ED     EKG None  Radiology Dg Chest 2 View  Result Date: 06/19/2018 CLINICAL DATA:  22 year old male with testicular cancer on chemotherapy presenting with fever. EXAM: CHEST - 2 VIEW COMPARISON:  None. FINDINGS: Right-sided Port-A-Cath with tip at the cavoatrial junction. The lungs are clear. There is no pleural effusion or pneumothorax. The cardiac silhouette is within normal limits. No acute osseous pathology. IMPRESSION: No active cardiopulmonary disease. Electronically Signed   By: Anner Crete M.D.   On: 06/19/2018 01:21   Ct Abdomen Pelvis W Contrast  Result Date: 06/19/2018 CLINICAL DATA:  22 year old male with abdominal pain. History of testicular carcinoma. EXAM: CT ABDOMEN AND PELVIS WITH CONTRAST TECHNIQUE: Multidetector CT imaging of the abdomen and pelvis was performed using the standard protocol following bolus administration of intravenous contrast. CONTRAST:  143mL ISOVUE-300 IOPAMIDOL (ISOVUE-300) INJECTION 61% COMPARISON:  CT of the abdomen pelvis dated 04/17/2018 FINDINGS:  Lower chest: The visualized lung bases are clear. No intra-abdominal free air or free fluid. Hepatobiliary: No focal liver abnormality is seen. No gallstones, gallbladder wall thickening, or biliary dilatation. Pancreas: Unremarkable. No pancreatic ductal dilatation or surrounding inflammatory changes. Spleen: Normal in size without focal abnormality. Adrenals/Urinary Tract: Adrenal glands are unremarkable. Kidneys are normal, without renal calculi, focal lesion, or hydronephrosis. Bladder is unremarkable. Stomach/Bowel: Stomach is within normal limits. Appendix appears normal. No evidence of bowel wall thickening, distention, or inflammatory changes. Vascular/Lymphatic: The abdominal aorta and IVC appear unremarkable. No portal venous gas. Lower retroperitoneal aortoiliac adenopathy measures 19 mm and decreased in size compared to the prior CT. Reproductive: The prostate and seminal vesicles are grossly  unremarkable. No pelvic mass. Other: None Musculoskeletal: No acute or significant osseous findings. IMPRESSION: 1. No acute intra-abdominal or pelvic pathology. 2. Decrease in the size of retroperitoneal adenopathy in the aortocaval space. Electronically Signed   By: Anner Crete M.D.   On: 06/19/2018 04:12    Procedures .Critical Care Performed by: Doristine Devoid, PA-C Authorized by: Doristine Devoid, PA-C   Critical care provider statement:    Critical care time (minutes):  50   Critical care was necessary to treat or prevent imminent or life-threatening deterioration of the following conditions:  Sepsis   Critical care was time spent personally by me on the following activities:  Development of treatment plan with patient or surrogate, discussions with consultants, discussions with primary provider, evaluation of patient's response to treatment, examination of patient, obtaining history from patient or surrogate, ordering and performing treatments and interventions, ordering and review of laboratory studies, ordering and review of radiographic studies, pulse oximetry, re-evaluation of patient's condition and review of old charts   (including critical care time)  Medications Ordered in ED Medications  iopamidol (ISOVUE-300) 61 % injection (has no administration in time range)  piperacillin-tazobactam (ZOSYN) IVPB 3.375 g (has no administration in time range)  sodium chloride 0.9 % bolus 1,000 mL (0 mLs Intravenous Stopped 06/19/18 0105)  ondansetron (ZOFRAN) injection 4 mg (4 mg Intravenous Given 06/19/18 0000)  sodium chloride 0.9 % bolus 1,000 mL (0 mLs Intravenous Stopped 06/19/18 0105)  sodium chloride 0.9 % bolus 500 mL (0 mLs Intravenous Stopped 06/19/18 0239)  piperacillin-tazobactam (ZOSYN) IVPB 3.375 g (0 g Intravenous Stopped 06/19/18 0056)  vancomycin (VANCOCIN) 1,500 mg in sodium chloride 0.9 % 500 mL IVPB (0 mg Intravenous Stopped 06/19/18 0316)  iopamidol (ISOVUE-300)  61 % injection 100 mL (100 mLs Intravenous Contrast Given 06/19/18 0325)     Initial Impression / Assessment and Plan / ED Course  I have reviewed the triage vital signs and the nursing notes.  Pertinent labs & imaging results that were available during my care of the patient were reviewed by me and considered in my medical decision making (see chart for details).     Patient presents to the ED with a history of testicular cancer for evaluation of fever, vomiting, abdominal pain.  Initial arrival to the ED patient was hypertensive with systolic blood pressures in the 80s.  He was also tachycardic at 130.  He was afebrile however received Motrin prior to arrival.  Patient has a leukocytosis.  No significant electrolyte derangement.  UA shows no signs of infection.  Blood cultures are pending.  Lactic acid was normal.  Chest x-ray shows no signs of focal infiltrate.  Patient does have pain to palpation of the right lower abdomen.  CT scan was performed to rule out appendicitis.  This was reassuring.  Given the patient has tachycardia, hypotension and leukocytosis sepsis protocol was initiated.  Broad-spectrum antibiotics were started.  Patient received a 30 cc/kg fluid bolus.  Pressures improved and heart rate improved.  Blood pressures remained stable at this time.  Patient discussed with Dr. Hal Hope with hospital medicine who agrees to admission will place admission orders.  Patient is hemodynamic stable this time and resting calmly in the bed. Seen and eval by my attending who is agreeable with the above plan.   Final Clinical Impressions(s) / ED Diagnoses   Final diagnoses:  SIRS (systemic inflammatory response syndrome) [facility])    ED Discharge Orders    None       Aaron Edelman 06/19/18 [id number]    Fatima Blank, MD 06/19/18 [phone]

## 2018-06-19 NOTE — Discharge Summary (Signed)
Physician Discharge Summary  Greg Peterson BJS:[id number] DOB: December 18, 1995 DOA: 06/18/2018  PCP: Manon Hilding, MD  Admit date: 06/18/2018 Discharge date: 06/19/2018  Admitted From: Home Disposition: Left AMA  Recommendations for Outpatient Follow-up:  1. Follow up with PCP in 1-2 weeks 2. Follow up with Primary Oncologist Dr. [name] in 1-2 weeks 3. Please obtain CMP/CBC, Mag, Phos in one week 4. Please follow up on the following pending results: Blood Cultures, Labs that are still pending as patient left AMA   Home Health: No Equipment/Devices: None   Discharge Condition: Guarded CODE STATUS: FULL CODE Diet recommendation: Regular Diet  Brief/Interim Summary: HPI: Greg Peterson is a 22 y.o. male with medical history significant of autism spectrum disorder, history of testicular cancer followed by Dr. Zola Button and other comorbidities who presented to [facility] emergency room yesterday after his chemotherapy session with a chief complaint of nausea vomiting and a fever.  Patient spiked a temperature of 102 last night and took some ibuprofen and subsequently also had some nausea vomiting presented to [facility] for evaluation.  Denied any chest pain, shortness breath, lightheadedness or dizziness but did admit to have a slight headache.  No other concerns or complaints at this time [facility] was called to admit this patient for SIRS.   ED Course: Sepsis protocol initiated and patient was given 2.5 L boluses of IV normal saline and started on empiric antibiotics with IV vancomycin and IV Zosyn.  Patient was also given 4 mg of IV Zofran and made n.p.o.  Patient also had a CT of the abdomen pelvis which was reassuring.  Basic blood work was also done  While waiting in the emergency room patient decided he no longer want to be hospitalized and signed himself out [place].  Patient's father was at bedside and he states that they will just follow-up with primary oncologist.  Patient  left before I could talk to him and I was paged he had signed the paper and left.  Discharge Diagnoses:  Active Problems:   Testis cancer ([place])   SIRS (systemic inflammatory response syndrome) (HCC)   Hyperglycemia   Autism spectrum disorder   Thrombocytopenia (HCC)   Hyponatremia   Nausea & vomiting   Elevated ALT measurement   Hyperbilirubinemia  SIRS with Fever, Tachycardia, Hypotension with no clear evidence of infection at this time and likely Chemotherapy Induced r/o Infection  -Place in observation Med-Surge; tachycardia has now resolved the fever has also resolved.  Patient's blood pressures have improved but are remaining on the softer side -Given IV Zosyn and IV vancomycin in the emergency room -We will continue with empiric antibiotics with IV cefepime -Given 2.5 L of normal saline boluses; will continue maintenance fluid IV normal saline at 100 and mils per hour -Blood cultures x2 obtained in the ED and will need to follow -WBC on admission 11.6 and is now improved to 4.5 -Lactic acid level was reassuring went from 1.29 and is now 0.63 -Checking procalcitonin level -Urinalysis unremarkable and urine cultures pending -CT of the abdomen and pelvis showed No acute intra-abdominal or pelvic pathology.Decrease in the size of retroperitoneal adenopathy in the aortocaval space. -Continue to monitor fever curve and follow blood cultures -Continue with empiric antibiotics with IV Cefepime and de-escalate as necessary -C/w Antiemetics with 4 mg of IV Zofran and home Prochlorperazine -Informed Dr. [name] of patient's admission but left AMA -Patient to follow-up with primary care physician as well as primary oncologist in the outpatient  setting  Testicular Cancer -Followed by Dr. [name] -Had chemotherapy yesterday with Bleomycin -Notified Dr. [name] of Admission but patient left AMA  Abdominal Pain, improved   -Improved -CT of the abdomen pelvis reassuring -Continue with  symptomatic care and Pain control with IV Morphine 1 mg q4hprn Severe Pain and Home Oxycodone -Check Lipase Level; Ordered but never done as patient left AMA  Elevated ALT -Likely reactive from nausea vomiting -Patient's AST is normal however ALT went from 47 and is now 48 mildly elevated -Repeat CMP as an outpatient   Autism spectrum disorder -Stable with no current active issues  Hyperglycemia -Check patient's hemoglobin A1c -If blood sugars elevated will place on sensitive NovoLog sliding scale AC -Patient left before HbA1c could result and will need to have it followed as an outpatient   Thrombocytopenia, suspected Chemotherapy induced, improved  -Improved patient's platelet count was 130 and is now 169 -Continue to monitor and trend and repeat CBC as an outpatient.   Hyponatremia, improved -Patient's sodium was 134 likely from nausea vomiting and now is improved to 136 after fluid hydration -Continue to monitor and repeat CMP as an outpatient   Hyperbilirubinemia -Patient's T bili was 1.4 admission is now improved to 0.8 -Continue to monitor and trend by repeating CMP as an outpatietn   Discharge Instructions  Discharge Instructions    Call MD for:  difficulty breathing, headache or visual disturbances   Complete by:  As directed    Call MD for:  extreme fatigue   Complete by:  As directed    Call MD for:  hives   Complete by:  As directed    Call MD for:  persistant dizziness or light-headedness   Complete by:  As directed    Call MD for:  persistant nausea and vomiting   Complete by:  As directed    Call MD for:  redness, tenderness, or signs of infection (pain, swelling, redness, odor or green/yellow discharge around incision site)   Complete by:  As directed    Call MD for:  severe uncontrolled pain   Complete by:  As directed    Call MD for:  temperature >100.4   Complete by:  As directed    Diet - low sodium heart healthy   Complete by:  As directed     Discharge instructions   Complete by:  As directed    Follow-up with primary care physician and primary oncologist Dr. [name].  Take all medications as prescribed.  If symptoms change or worsen please return the emergency room for evaluation.   Increase activity slowly   Complete by:  As directed      Allergies as of 06/19/2018   No Known Allergies     Medication List    TAKE these medications   citalopram 10 MG tablet Commonly known as:  CELEXA Take 10 mg by mouth daily.   HYDROcodone-acetaminophen 5-325 MG tablet Commonly known as:  NORCO Take 1 tablet by mouth every 6 (six) hours as needed for moderate pain.   hydrocortisone 2.5 % rectal cream Commonly known as:  ANUSOL-HC Place 1 application rectally 2 (two) times daily.   ibuprofen 200 MG tablet Commonly known as:  ADVIL,MOTRIN Take 200 mg by mouth every 6 (six) hours as needed for moderate pain.   lidocaine-prilocaine cream Commonly known as:  EMLA Apply 1 application topically as needed. Apply to port area 1-2 hours before coming to [facility] for treatment   prochlorperazine 10 MG tablet Commonly  known as:  COMPAZINE Take 1 tablet (10 mg total) by mouth every 6 (six) hours as needed for nausea or vomiting.   triamcinolone cream 0.1 % Commonly known as:  KENALOG Apply 1 application topically 2 (two) times daily. Apply to rash twice daily until rash resolved.       No Known Allergies  Consultations:  Medical Oncology Dr. [name]  Procedures/Studies: Dg Chest 2 View  Result Date: 06/19/2018 CLINICAL DATA:  22 year old male with testicular cancer on chemotherapy presenting with fever. EXAM: CHEST - 2 VIEW COMPARISON:  None. FINDINGS: Right-sided Port-A-Cath with tip at the cavoatrial junction. The lungs are clear. There is no pleural effusion or pneumothorax. The cardiac silhouette is within normal limits. No acute osseous pathology. IMPRESSION: No active cardiopulmonary disease. Electronically Signed    By: Anner Crete M.D.   On: 06/19/2018 01:21   Ct Abdomen Pelvis W Contrast  Result Date: 06/19/2018 CLINICAL DATA:  22 year old male with abdominal pain. History of testicular carcinoma. EXAM: CT ABDOMEN AND PELVIS WITH CONTRAST TECHNIQUE: Multidetector CT imaging of the abdomen and pelvis was performed using the standard protocol following bolus administration of intravenous contrast. CONTRAST:  134mL ISOVUE-300 IOPAMIDOL (ISOVUE-300) INJECTION 61% COMPARISON:  CT of the abdomen pelvis dated 04/17/2018 FINDINGS: Lower chest: The visualized lung bases are clear. No intra-abdominal free air or free fluid. Hepatobiliary: No focal liver abnormality is seen. No gallstones, gallbladder wall thickening, or biliary dilatation. Pancreas: Unremarkable. No pancreatic ductal dilatation or surrounding inflammatory changes. Spleen: Normal in size without focal abnormality. Adrenals/Urinary Tract: Adrenal glands are unremarkable. Kidneys are normal, without renal calculi, focal lesion, or hydronephrosis. Bladder is unremarkable. Stomach/Bowel: Stomach is within normal limits. Appendix appears normal. No evidence of bowel wall thickening, distention, or inflammatory changes. Vascular/Lymphatic: The abdominal aorta and IVC appear unremarkable. No portal venous gas. Lower retroperitoneal aortoiliac adenopathy measures 19 mm and decreased in size compared to the prior CT. Reproductive: The prostate and seminal vesicles are grossly unremarkable. No pelvic mass. Other: None Musculoskeletal: No acute or significant osseous findings. IMPRESSION: 1. No acute intra-abdominal or pelvic pathology. 2. Decrease in the size of retroperitoneal adenopathy in the aortocaval space. Electronically Signed   By: Anner Crete M.D.   On: 06/19/2018 04:12    Subjective: Patient was admitted this morning however during his weight in the ED he decided he no longer be hospitalized and signed the papers and left [place].  Discharge Exam: Vitals:   06/19/18 1331 06/19/18 1345  BP: 100/63 107/62  Pulse: 86 89  Resp: 16 20  Temp:    SpO2: 98% 98%   Vitals:   06/19/18 1130 06/19/18 1215 06/19/18 1331 06/19/18 1345  BP: 107/72 (!) 93/56 100/63 107/62  Pulse: 80 82 86 89  Resp: 18 17 16 20   Temp:      TempSrc:      SpO2: 100% 98% 98% 98%  Weight:      Height:       NO PHYSICAL EXAM ON DISCHARGE AS PATIENT LEFT AMA; SEE PHYSICAL EXAM FROM H and P  The results of significant diagnostics from this hospitalization (including imaging, microbiology, ancillary and laboratory) are listed below for reference.    Microbiology: Recent Results (from the past 240 hour(s))  Culture, blood (routine x 2)     Status: None (Preliminary result)   Collection Time: 06/18/18 11:42 PM  Result Value Ref Range Status   Specimen Description   Final    BLOOD RIGHT ARM Performed at  [facility], [place] [address], [place], [place] [address]    Special Requests   Final    BOTTLES DRAWN AEROBIC AND ANAEROBIC Blood Culture results may not be optimal due to an excessive volume of blood received in culture bottles Performed at [facility] [address] [place], [place] [address]    Culture PENDING  Incomplete   Report Status PENDING  Incomplete    Labs: BNP (last 3 results) No results for input(s): BNP in the last 8760 hours. Basic Metabolic Panel: Recent Labs  Lab 06/18/18 0002 06/18/18 1304  NA 134* 136  K 3.5 4.5  CL 96* 98  CO2 27 30  GLUCOSE 127* 89  BUN 26* 22*  CREATININE 1.24 1.13  CALCIUM 8.8* 9.5   Liver Function Tests: Recent Labs  Lab 06/18/18 0002 06/18/18 1304  AST 30 26  ALT 47* 48*  ALKPHOS 63 79  BILITOT 1.4* 0.8  PROT 6.9 7.4  ALBUMIN 3.8 4.1   No results for input(s): LIPASE, AMYLASE in the last 168 hours. No results for input(s): AMMONIA in the last 168 hours. CBC: Recent Labs  Lab 06/18/18 0002 06/18/18 1304  WBC 11.6* 4.5  NEUTROABS 11.1*  3.5  HGB 14.5 15.5  HCT 40.8 43.6  MCV 83.1 83.2  PLT 138* 169   Cardiac Enzymes: No results for input(s): CKTOTAL, CKMB, CKMBINDEX, TROPONINI in the last 168 hours. BNP: Invalid input(s): POCBNP CBG: No results for input(s): GLUCAP in the last 168 hours. D-Dimer No results for input(s): DDIMER in the last 72 hours. Hgb A1c No results for input(s): HGBA1C in the last 72 hours. Lipid Profile No results for input(s): CHOL, HDL, LDLCALC, TRIG, CHOLHDL, LDLDIRECT in the last 72 hours. Thyroid function studies No results for input(s): TSH, T4TOTAL, T3FREE, THYROIDAB in the last 72 hours.  Invalid input(s): FREET3 Anemia work up No results for input(s): VITAMINB12, FOLATE, FERRITIN, TIBC, IRON, RETICCTPCT in the last 72 hours. Urinalysis    Component Value Date/Time   COLORURINE YELLOW 06/19/2018 0422   APPEARANCEUR CLEAR 06/19/2018 0422   LABSPEC 1.017 06/19/2018 0422   PHURINE 6.0 06/19/2018 0422   GLUCOSEU NEGATIVE 06/19/2018 0422   HGBUR NEGATIVE 06/19/2018 0422   BILIRUBINUR NEGATIVE 06/19/2018 0422   KETONESUR NEGATIVE 06/19/2018 0422   PROTEINUR NEGATIVE 06/19/2018 0422   NITRITE NEGATIVE 06/19/2018 0422   LEUKOCYTESUR NEGATIVE 06/19/2018 0422   Sepsis Labs Invalid input(s): PROCALCITONIN,  WBC,  LACTICIDVEN Microbiology Recent Results (from the past 240 hour(s))  Culture, blood (routine x 2)     Status: None (Preliminary result)   Collection Time: 06/18/18 11:42 PM  Result Value Ref Range Status   Specimen Description   Final    BLOOD RIGHT ARM Performed at [facility], [place] [address] [place], [place] [address]    Special Requests   Final    BOTTLES DRAWN AEROBIC AND ANAEROBIC Blood Culture results may not be optimal due to an excessive volume of blood received in culture bottles Performed at [facility] [address], [place], [place] [address]    Culture PENDING  Incomplete   Report Status PENDING  Incomplete   Time  coordinating discharge: 25 minutes  SIGNED:  Kerney Elbe, DO [facility] 06/19/2018, 3:03 PM Pager [phone]  If 7PM-7AM, please contact night-coverage [email or web address] Password [email or web address]

## 2018-06-19 NOTE — ED Notes (Signed)
Patient transported to X-ray 

## 2018-06-19 NOTE — ED Notes (Signed)
Patient transported to CT 

## 2018-06-19 NOTE — Progress Notes (Signed)
Pharmacy Antibiotic Note  Greg Peterson is a 22 y.o. male admitted on 06/18/2018 with sepsis. Pharmacy has been consulted for cefepime dosing.  Patient has a history of testicular cancer, presenting with fever  Plan: Cefepime 2 g IV q8h  Height: 5\' 11"  (180.3 cm) Weight: 177 lb 6.4 oz (80.5 kg) IBW/kg (Calculated) : 75.3  Temp (24hrs), Avg:98 F (36.7 C), Min:97.6 F (36.4 C), Max:98.3 F (36.8 C)  Recent Labs  Lab 06/18/18 0002 06/18/18 1304 06/19/18 0011 06/19/18 0253  WBC 11.6* 4.5  --   --   CREATININE 1.24 1.13  --   --   LATICACIDVEN  --   --  1.29 0.63    Estimated Creatinine Clearance: 108.3 mL/min (by C-G formula based on SCr of 1.13 mg/dL).    No Known Allergies  Antimicrobials this admission: cefepime 7/17 >>  Vancomycin and zosyn in ED 7/17 >>   Dose adjustments this admission:  Microbiology results: 7/17 BCx: Sent 7/17 UCx: Sent   Thank you for allowing pharmacy to be a part of this patient's care.  Lenis Noon, PharmD, BCPS Clinical Pharmacist 06/19/2018 8:48 AM

## 2018-06-19 NOTE — Progress Notes (Addendum)
PHARMACY BRIEF NOTE:  Antibiotic dosing  Patient received one-time IV doses of Zosyn 3.375 grams and vancomycin 1500 mg as empiric treatment for sepsis at [place] and 0056 respectively.   Patient now being admitted and admission orders are pending.   Next Zosyn dose now due.   Have ordered Zosyn 3.375 grams IV x 1.  Will await antibiotic orders from admitting MD before proceeding with any additional doses of Zosyn or vancomycin.  Clayburn Pert, PharmD, BCPS [phone] 06/19/2018  6:18 AM

## 2018-06-19 NOTE — Progress Notes (Signed)
A consult was received from an ED physician for vanc and zosyn per pharmacy dosing.  The patient's profile has been reviewed for ht/wt/allergies/indication/available labs.   A one time order has been placed for zosyn 3.375gm and vanc 1500mg .      Further antibiotics/pharmacy consults should be ordered by admitting physician if indicated.                       Thank you, Dolly Rias RPh 06/19/2018, 12:15 AM Pager [phone]

## 2018-06-19 NOTE — H&P (Addendum)
History and Physical    Greg Peterson GUR:[id number] DOB: 1996-09-19 DOA: 06/18/2018  PCP: Manon Hilding, MD   Patient coming from: Home  Chief Complaint: Nausea, Vomiting, Fever  HPI: Greg Peterson is a 22 y.o. male with medical history significant of autism spectrum disorder, history of testicular cancer followed by Dr. Zola Button and other comorbidities who presented to [facility] emergency room yesterday after his chemotherapy session with a chief complaint of nausea vomiting and a fever.  Patient spiked a temperature of 102 last night and took some ibuprofen and subsequently also had some nausea vomiting presented to [facility] for evaluation.  Denied any chest pain, shortness breath, lightheadedness or dizziness but did admit to have a slight headache.  No other concerns or complaints at this time [facility] was called to admit this patient for SIRS.   ED Course: Sepsis protocol initiated and patient was given 2.5 L boluses of IV normal saline and started on empiric antibiotics with IV vancomycin and IV Zosyn.  Patient was also given 4 mg of IV Zofran and made n.p.o.  Patient also had a CT of the abdomen pelvis which was reassuring.  Basic blood work was also done  Review of Systems: As per HPI otherwise 10 point review of systems negative.   Past Medical History:  Diagnosis Date  . Autism spectrum disorder   . Cancer [facility])    testicular   Past Surgical History:  Procedure Laterality Date  . IR FLUORO GUIDE PORT INSERTION RIGHT  05/07/2018  . IR US GUIDE VASC ACCESS RIGHT  05/07/2018  . ORCHIECTOMY Right 01/04/2018   Procedure: RIGHT RADICAL ORCHIECTOMY;  Surgeon: Irine Seal, MD;  Location: [facility];  Service: Urology;  Laterality: Right;   SOCIAL HISTORY  reports that he has never smoked. He has never used smokeless tobacco. He reports that he does not drink alcohol or use drugs.  ALLERGIES No Known Allergies  FAMILY HISTORY History reviewed. No pertinent family  history to patient's chief complaints.   Prior to Admission medications   Medication Sig Start Date End Date Taking? Authorizing Provider  citalopram (CELEXA) 10 MG tablet Take 10 mg by mouth daily.   Yes [provider]  hydrocortisone (ANUSOL-HC) 2.5 % rectal cream Place 1 application rectally 2 (two) times daily. 05/20/18  Yes Tanner, Lyndon Code., PA-C  ibuprofen (ADVIL,MOTRIN) 200 MG tablet Take 200 mg by mouth every 6 (six) hours as needed for moderate pain.    Yes [provider]  prochlorperazine (COMPAZINE) 10 MG tablet Take 1 tablet (10 mg total) by mouth every 6 (six) hours as needed for nausea or vomiting. 05/01/18  Yes Wyatt Portela, MD  triamcinolone cream (KENALOG) 0.1 % Apply 1 application topically 2 (two) times daily. Apply to rash twice daily until rash resolved. 06/12/18  Yes Tanner, Lyndon Code., PA-C  HYDROcodone-acetaminophen (NORCO) 5-325 MG tablet Take 1 tablet by mouth every 6 (six) hours as needed for moderate pain. 01/04/18   Irine Seal, MD  lidocaine-prilocaine (EMLA) cream Apply 1 application topically as needed. Apply to port area 1-2 hours before coming to [facility] for treatment 05/06/18   Wyatt Portela, MD   Physical Exam: Vitals:   06/19/18 0630 06/19/18 0715 06/19/18 0800 06/19/18 0845  BP: 103/69 95/61 102/67 102/67  Pulse: 84 81 84 83  Resp: 20 14 13 14   Temp:      TempSrc:      SpO2: 100% 98% 100% 97%  Weight:      Height:       Constitutional: WN/WD chronically ill appearing Caucasian male in, NAD and appears calm and comfortable Eyes: Lids and conjunctivae normal, sclerae anicteric  ENMT: External Ears, Nose appear normal. Grossly normal hearing. Mucous membranes are moist. Posterior pharynx clear of any exudate or lesions. Normal dentition.  Neck: Appears normal, supple, no cervical masses, normal ROM, no appreciable thyromegaly Chest Wall: Has a Port-A-Cath in place on Right  Respiratory: Diminished to auscultation bilaterally, no  wheezing, rales, rhonchi or crackles. Normal respiratory effort and patient is not tachypenic. No accessory muscle use.  Cardiovascular: RRR, no murmurs / rubs / gallops. S1 and S2 auscultated. No extremity edema.  Abdomen: Soft, non-tender, non-distended. No masses palpated. No appreciable hepatosplenomegaly. Bowel sounds positive x4.  GU: Deferred. Musculoskeletal: No clubbing / cyanosis of digits/nails. No joint deformity upper and lower extremities.  Skin: No rashes, lesions, ulcers on a limited skin evaluation. No induration; Warm and dry.  Neurologic: CN 2-12 grossly intact with no focal deficits. Romberg sign and cerebellar reflexes not assessed.  Psychiatric: Normal judgment and insight. Alert and oriented x 3. Normal mood and appropriate affect.   Labs on Admission: I have personally reviewed following labs and imaging studies  CBC: Recent Labs  Lab 06/18/18 0002 06/18/18 1304  WBC 11.6* 4.5  NEUTROABS 11.1* 3.5  HGB 14.5 15.5  HCT 40.8 43.6  MCV 83.1 83.2  PLT 138* 297   Basic Metabolic Panel: Recent Labs  Lab 06/18/18 0002 06/18/18 1304  NA 134* 136  K 3.5 4.5  CL 96* 98  CO2 27 30  GLUCOSE 127* 89  BUN 26* 22*  CREATININE 1.24 1.13  CALCIUM 8.8* 9.5   GFR: Estimated Creatinine Clearance: 108.3 mL/min (by C-G formula based on SCr of 1.13 mg/dL). Liver Function Tests: Recent Labs  Lab 06/18/18 0002 06/18/18 1304  AST 30 26  ALT 47* 48*  ALKPHOS 63 79  BILITOT 1.4* 0.8  PROT 6.9 7.4  ALBUMIN 3.8 4.1   No results for input(s): LIPASE, AMYLASE in the last 168 hours. No results for input(s): AMMONIA in the last 168 hours. Coagulation Profile: No results for input(s): INR, PROTIME in the last 168 hours. Cardiac Enzymes: No results for input(s): CKTOTAL, CKMB, CKMBINDEX, TROPONINI in the last 168 hours. BNP (last 3 results) No results for input(s): PROBNP in the last 8760 hours. HbA1C: No results for input(s): HGBA1C in the last 72 hours. CBG: No  results for input(s): GLUCAP in the last 168 hours. Lipid Profile: No results for input(s): CHOL, HDL, LDLCALC, TRIG, CHOLHDL, LDLDIRECT in the last 72 hours. Thyroid Function Tests: No results for input(s): TSH, T4TOTAL, FREET4, T3FREE, THYROIDAB in the last 72 hours. Anemia Panel: No results for input(s): VITAMINB12, FOLATE, FERRITIN, TIBC, IRON, RETICCTPCT in the last 72 hours. Urine analysis:    Component Value Date/Time   COLORURINE YELLOW 06/19/2018 0422   APPEARANCEUR CLEAR 06/19/2018 0422   LABSPEC 1.017 06/19/2018 0422   PHURINE 6.0 06/19/2018 0422   GLUCOSEU NEGATIVE 06/19/2018 0422   HGBUR NEGATIVE 06/19/2018 0422   BILIRUBINUR NEGATIVE 06/19/2018 0422   KETONESUR NEGATIVE 06/19/2018 0422   PROTEINUR NEGATIVE 06/19/2018 0422   NITRITE NEGATIVE 06/19/2018 0422   LEUKOCYTESUR NEGATIVE 06/19/2018 0422   Sepsis Labs: !!!!!!!!!!!!!!!!!!!!!!!!!!!!!!!!!!!!!!!!!!!! @LABRCNTIP (procalcitonin:4,lacticidven:4) ) Recent Results (from the past 240 hour(s))  Culture, blood (routine x 2)     Status: None (Preliminary result)   Collection Time: 06/18/18 11:42 PM  Result Value Ref Range  Status   Specimen Description   Final    BLOOD RIGHT ARM Performed at [facility], [place] [address] [place], [place] [address]    Special Requests   Final    BOTTLES DRAWN AEROBIC AND ANAEROBIC Blood Culture results may not be optimal due to an excessive volume of blood received in culture bottles Performed at [facility] [address] [place], [place] [address]    Culture PENDING  Incomplete   Report Status PENDING  Incomplete    Radiological Exams on Admission: Dg Chest 2 View  Result Date: 06/19/2018 CLINICAL DATA:  22 year old male with testicular cancer on chemotherapy presenting with fever. EXAM: CHEST - 2 VIEW COMPARISON:  None. FINDINGS: Right-sided Port-A-Cath with tip at the cavoatrial junction. The lungs are clear. There is no pleural effusion or  pneumothorax. The cardiac silhouette is within normal limits. No acute osseous pathology. IMPRESSION: No active cardiopulmonary disease. Electronically Signed   By: Anner Crete M.D.   On: 06/19/2018 01:21   Ct Abdomen Pelvis W Contrast  Result Date: 06/19/2018 CLINICAL DATA:  22 year old male with abdominal pain. History of testicular carcinoma. EXAM: CT ABDOMEN AND PELVIS WITH CONTRAST TECHNIQUE: Multidetector CT imaging of the abdomen and pelvis was performed using the standard protocol following bolus administration of intravenous contrast. CONTRAST:  153mL ISOVUE-300 IOPAMIDOL (ISOVUE-300) INJECTION 61% COMPARISON:  CT of the abdomen pelvis dated 04/17/2018 FINDINGS: Lower chest: The visualized lung bases are clear. No intra-abdominal free air or free fluid. Hepatobiliary: No focal liver abnormality is seen. No gallstones, gallbladder wall thickening, or biliary dilatation. Pancreas: Unremarkable. No pancreatic ductal dilatation or surrounding inflammatory changes. Spleen: Normal in size without focal abnormality. Adrenals/Urinary Tract: Adrenal glands are unremarkable. Kidneys are normal, without renal calculi, focal lesion, or hydronephrosis. Bladder is unremarkable. Stomach/Bowel: Stomach is within normal limits. Appendix appears normal. No evidence of bowel wall thickening, distention, or inflammatory changes. Vascular/Lymphatic: The abdominal aorta and IVC appear unremarkable. No portal venous gas. Lower retroperitoneal aortoiliac adenopathy measures 19 mm and decreased in size compared to the prior CT. Reproductive: The prostate and seminal vesicles are grossly unremarkable. No pelvic mass. Other: None Musculoskeletal: No acute or significant osseous findings. IMPRESSION: 1. No acute intra-abdominal or pelvic pathology. 2. Decrease in the size of retroperitoneal adenopathy in the aortocaval space. Electronically Signed   By: Anner Crete M.D.   On: 06/19/2018 04:12   EKG: Independently  reviewed. Showed Sinus Tachycardia at a rate of 108 and no evidence of ST Elevation on my interpretation.  Assessment/Plan Active Problems:   Testis cancer (HCC)   SIRS (systemic inflammatory response syndrome) (HCC)   Hyperglycemia   Autism spectrum disorder   Thrombocytopenia (HCC)   Hyponatremia   Nausea & vomiting   Elevated ALT measurement   Hyperbilirubinemia  SIRS with Fever, Tachycardia, Hypotension with no clear evidence of infection at this time and likely Chemotherapy Induced r/o Infection  -Place in observation Med-Surge; tachycardia has now resolved the fever has also resolved.  Patient's blood pressures have improved but are remaining on the softer side -Given IV Zosyn and IV vancomycin in the emergency room -We will continue with empiric antibiotics with IV cefepime -Given 2.5 L of normal saline boluses; will continue maintenance fluid IV normal saline at 100 and mils per hour -Blood cultures x2 obtained in the ED and will need to follow -WBC on admission 11.6 and is now improved to 4.5 -Lactic acid level was reassuring went from 1.29 and is now  0.63 -Check procalcitonin level -Urinalysis unremarkable and urine cultures pending -CT of the abdomen and pelvis showed No acute intra-abdominal or pelvic pathology.Decrease in the size of retroperitoneal adenopathy in the aortocaval space. -Continue to monitor fever curve and follow blood cultures -Continue with empiric antibiotics with IV Cefepime and de-escalate as necessary -C/w Antiemetics with 4 mg of IV Zofran and home Prochlorperazine -We will inform Dr. [name] of patient's admission via epic  Testicular Cancer -Followed by Dr. [name] -Had chemotherapy yesterday with Bleomycin -Will notify Dr. [name] of Admission   Abdominal Pain, improved   -Improved -CT of the abdomen pelvis reassuring -Continue with symptomatic care and Pain control with IV Morphine 1 mg q4hprn Severe Pain and Home Oxycodone -Check Lipase  Level   Elevated ALT -Likely reactive from nausea vomiting -Patient's AST is normal however ALT went from 47 and is now 48 mildly elevated -Repeat CMP in AM   Autism spectrum disorder -Stable with no current active issues  Hyperglycemia -Check patient's hemoglobin A1c -If blood sugars elevated will place on sensitive NovoLog sliding scale AC  Thrombocytopenia, suspected Chemotherapy induced, improved  -Improved patient's platelet count was 130 and is now 169 -Continue to monitor and trend and repeat CBC in the a.m.  Hyponatremia, improved -Patient's sodium was 134 likely from nausea vomiting and now is improved to 136 after fluid hydration -Continue to monitor and repeat CMP in a.m.  Hyperbilirubinemia -Patient's T bili was 1.4 admission is now improved to 0.8 -Continue to monitor and trend by repeating CMP in AM   DVT prophylaxis: Heparin 5,000 units sq q8h Code Status: FULL CODE Family Communication: Discussed with Father at bedside  Disposition Plan: Anticipate D/C back to Home environment Consults called: Dr. [name] notified via Epic Admission status: Obs Med-Surge  Severity of Illness: The appropriate patient status for this patient is OBSERVATION. Observation status is judged to be reasonable and necessary in order to provide the required intensity of service to ensure the patient's safety. The patient's presenting symptoms, physical exam findings, and initial radiographic and laboratory data in the context of their medical condition is felt to place them at decreased risk for further clinical deterioration. Furthermore, it is anticipated that the patient will be medically stable for discharge from the hospital within 2 midnights of admission. The following factors support the patient status of observation.   " The patient's presenting symptoms include N/V/Abdominal pain. " The physical exam findings include Dry membranes. " The initial radiographic and laboratory data are  reassuring.   Kerney Elbe, D.O. [facility] Pager [phone]  If 7PM-7AM, please contact night-coverage [email or web address] Password [facility]  06/19/2018, 9:41 AM

## 2018-06-20 ENCOUNTER — Telehealth: Payer: Self-pay | Admitting: *Deleted

## 2018-06-20 LAB — URINE CULTURE: CULTURE: NO GROWTH

## 2018-06-20 NOTE — Telephone Encounter (Signed)
Spoke with patient's father, javoni lucken. Per dr [name], patient does not need to be on antibiotics. Dr [name] feels that patient had a febrile reaction to the bleomycin and he will omit this drug from next treatment. Father verbalized understanding.

## 2018-06-24 LAB — CULTURE, BLOOD (ROUTINE X 2)
CULTURE: NO GROWTH
CULTURE: NO GROWTH
SPECIAL REQUESTS: ADEQUATE

## 2018-06-25 ENCOUNTER — Inpatient Hospital Stay: Payer: BLUE CROSS/BLUE SHIELD

## 2018-06-26 ENCOUNTER — Ambulatory Visit (HOSPITAL_COMMUNITY)
Admission: RE | Admit: 2018-06-26 | Discharge: 2018-06-26 | Disposition: A | Discharge status: Home / Self Care | Payer: BLUE CROSS/BLUE SHIELD | Source: Ambulatory Visit | Attending: Oncology | Admitting: Oncology

## 2018-06-26 DIAGNOSIS — C6290 Malignant neoplasm of unspecified testis, unspecified whether descended or undescended: Secondary | ICD-10-CM | POA: Diagnosis not present

## 2018-06-26 LAB — PULMONARY FUNCTION TEST
DL/VA % pred: 79 %
DL/VA: 3.76 ml/min/mmHg/L
DLCO UNC: 27.96 ml/min/mmHg
DLCO cor % pred: 83 %
DLCO cor: 28.04 ml/min/mmHg
DLCO unc % pred: 83 %
FEF 25-75 PRE: 5.68 L/s
FEF2575-%PRED-PRE: 115 %
FEV1-%PRED-PRE: 107 %
FEV1-Pre: 5.11 L
FEV1FVC-%Pred-Pre: 105 %
FEV6-%PRED-PRE: 102 %
FEV6-PRE: 5.86 L
FEV6FVC-%Pred-Pre: 101 %
FVC-%Pred-Pre: 102 %
FVC-PRE: 5.86 L
PRE FEV1/FVC RATIO: 87 %
Pre FEV6/FVC Ratio: 100 %

## 2018-07-01 ENCOUNTER — Ambulatory Visit (HOSPITAL_BASED_OUTPATIENT_CLINIC_OR_DEPARTMENT_OTHER): Payer: BLUE CROSS/BLUE SHIELD | Admitting: Medical

## 2018-07-01 ENCOUNTER — Telehealth: Payer: Self-pay | Admitting: Oncology

## 2018-07-01 ENCOUNTER — Inpatient Hospital Stay: Payer: BLUE CROSS/BLUE SHIELD

## 2018-07-01 ENCOUNTER — Inpatient Hospital Stay (HOSPITAL_BASED_OUTPATIENT_CLINIC_OR_DEPARTMENT_OTHER): Payer: BLUE CROSS/BLUE SHIELD | Admitting: Oncology

## 2018-07-01 VITALS — BP 113/84 | HR 88 | Temp 98.1°F | Resp 18 | Ht 71.0 in | Wt 187.9 lb

## 2018-07-01 VITALS — BP 121/86 | HR 78 | Temp 97.8°F | Resp 18

## 2018-07-01 DIAGNOSIS — Z9079 Acquired absence of other genital organ(s): Secondary | ICD-10-CM

## 2018-07-01 DIAGNOSIS — Z95828 Presence of other vascular implants and grafts: Secondary | ICD-10-CM

## 2018-07-01 DIAGNOSIS — T451X5A Adverse effect of antineoplastic and immunosuppressive drugs, initial encounter: Secondary | ICD-10-CM | POA: Diagnosis not present

## 2018-07-01 DIAGNOSIS — C6290 Malignant neoplasm of unspecified testis, unspecified whether descended or undescended: Secondary | ICD-10-CM

## 2018-07-01 DIAGNOSIS — K649 Unspecified hemorrhoids: Secondary | ICD-10-CM | POA: Diagnosis not present

## 2018-07-01 DIAGNOSIS — Z5111 Encounter for antineoplastic chemotherapy: Secondary | ICD-10-CM | POA: Diagnosis present

## 2018-07-01 DIAGNOSIS — C6291 Malignant neoplasm of right testis, unspecified whether descended or undescended: Secondary | ICD-10-CM

## 2018-07-01 DIAGNOSIS — R1013 Epigastric pain: Secondary | ICD-10-CM | POA: Diagnosis not present

## 2018-07-01 DIAGNOSIS — R21 Rash and other nonspecific skin eruption: Secondary | ICD-10-CM | POA: Diagnosis not present

## 2018-07-01 LAB — CBC WITH DIFFERENTIAL (CANCER CENTER ONLY)
BASOS ABS: 0 10*3/uL (ref 0.0–0.1)
Basophils Relative: 1 %
Eosinophils Absolute: 0.2 10*3/uL (ref 0.0–0.5)
Eosinophils Relative: 4 %
HEMATOCRIT: 40.9 % (ref 38.4–49.9)
Hemoglobin: 13.8 g/dL (ref 13.0–17.1)
LYMPHS ABS: 2.1 10*3/uL (ref 0.9–3.3)
LYMPHS PCT: 52 %
MCH: 29.4 pg (ref 27.2–33.4)
MCHC: 33.7 g/dL (ref 32.0–36.0)
MCV: 87 fL (ref 79.3–98.0)
Monocytes Absolute: 0.9 10*3/uL (ref 0.1–0.9)
Monocytes Relative: 21 %
NEUTROS ABS: 0.9 10*3/uL — AB (ref 1.5–6.5)
NEUTROS PCT: 22 %
Platelet Count: 271 10*3/uL (ref 140–400)
RBC: 4.7 MIL/uL (ref 4.20–5.82)
RDW: 14.2 % (ref 11.0–14.6)
WBC Count: 4.1 10*3/uL (ref 4.0–10.3)

## 2018-07-01 LAB — CMP (CANCER CENTER ONLY)
ALBUMIN: 3.9 g/dL (ref 3.5–5.0)
ALK PHOS: 64 U/L (ref 38–126)
ALT: 28 U/L (ref 0–44)
AST: 19 U/L (ref 15–41)
Anion gap: 7 (ref 5–15)
BILIRUBIN TOTAL: 0.5 mg/dL (ref 0.3–1.2)
BUN: 14 mg/dL (ref 6–20)
CALCIUM: 9.5 mg/dL (ref 8.9–10.3)
CO2: 26 mmol/L (ref 22–32)
Chloride: 109 mmol/L (ref 98–111)
Creatinine: 0.97 mg/dL (ref 0.61–1.24)
GFR, Est AFR Am: >60 mL/min (ref >60)
GFR, Estimated: >60 mL/min (ref >60)
Glucose, Bld: 114 mg/dL — ABNORMAL HIGH (ref 70–99)
Potassium: 4 mmol/L (ref 3.5–5.1)
Sodium: 142 mmol/L (ref 135–145)
TOTAL PROTEIN: 6.9 g/dL (ref 6.5–8.1)

## 2018-07-01 MED ORDER — FAMOTIDINE IN NACL 20-0.9 MG/50ML-% IV SOLN
INTRAVENOUS | Status: AC
  Filled 2018-07-01: qty 50

## 2018-07-01 MED ORDER — PROMETHAZINE HCL 25 MG/ML IJ SOLN
INTRAMUSCULAR | Status: AC
Start: 2018-07-01 — End: ?
  Filled 2018-07-01: qty 1

## 2018-07-01 MED ORDER — HEPARIN SOD (PORK) LOCK FLUSH 100 UNIT/ML IV SOLN
500.0000 [IU] | Freq: Once | INTRAVENOUS | Status: AC | PRN
  Administered 2018-07-01: 500 [IU]
  Filled 2018-07-01: qty 5

## 2018-07-01 MED ORDER — DIPHENHYDRAMINE HCL 50 MG/ML IJ SOLN
50.0000 mg | Freq: Once | INTRAMUSCULAR | Status: AC
  Administered 2018-07-01: 50 mg via INTRAVENOUS

## 2018-07-01 MED ORDER — SODIUM CHLORIDE 0.9% FLUSH
10.0000 mL | Freq: Once | INTRAVENOUS | Status: AC
  Administered 2018-07-01: 10 mL
  Filled 2018-07-01: qty 10

## 2018-07-01 MED ORDER — FAMOTIDINE IN NACL 20-0.9 MG/50ML-% IV SOLN
40.0000 mg | Freq: Once | INTRAVENOUS | Status: AC
  Administered 2018-07-01: 40 mg via INTRAVENOUS

## 2018-07-01 MED ORDER — SODIUM CHLORIDE 0.9% FLUSH
10.0000 mL | INTRAVENOUS | Status: DC | PRN
  Administered 2018-07-01: 10 mL
  Filled 2018-07-01: qty 10

## 2018-07-01 MED ORDER — PROMETHAZINE HCL 25 MG/ML IJ SOLN
12.5000 mg | Freq: Once | INTRAMUSCULAR | Status: AC
  Administered 2018-07-01: 12.5 mg via INTRAVENOUS

## 2018-07-01 MED ORDER — POTASSIUM CHLORIDE 2 MEQ/ML IV SOLN
Freq: Once | INTRAVENOUS | Status: AC
  Administered 2018-07-01: 11:00:00 via INTRAVENOUS
  Filled 2018-07-01: qty 10

## 2018-07-01 MED ORDER — SODIUM CHLORIDE 0.9 % IV SOLN
Freq: Once | INTRAVENOUS | Status: AC
  Administered 2018-07-01: 14:00:00 via INTRAVENOUS
  Filled 2018-07-01: qty 5

## 2018-07-01 MED ORDER — SODIUM CHLORIDE 0.9 % IV SOLN
INTRAVENOUS | Status: DC
  Administered 2018-07-01: 15:00:00 via INTRAVENOUS
  Filled 2018-07-01: qty 250

## 2018-07-01 MED ORDER — PALONOSETRON HCL INJECTION 0.25 MG/5ML
INTRAVENOUS | Status: AC
  Filled 2018-07-01: qty 5

## 2018-07-01 MED ORDER — DIPHENHYDRAMINE HCL 50 MG/ML IJ SOLN
INTRAMUSCULAR | Status: AC
  Filled 2018-07-01: qty 1

## 2018-07-01 MED ORDER — SODIUM CHLORIDE 0.9 % IV SOLN
Freq: Once | INTRAVENOUS | Status: AC
  Administered 2018-07-01: 10:00:00 via INTRAVENOUS
  Filled 2018-07-01: qty 250

## 2018-07-01 MED ORDER — METHYLPREDNISOLONE SODIUM SUCC 125 MG IJ SOLR
125.0000 mg | Freq: Once | INTRAMUSCULAR | Status: AC
  Administered 2018-07-01: 125 mg via INTRAVENOUS

## 2018-07-01 MED ORDER — FAMOTIDINE IN NACL 20-0.9 MG/50ML-% IV SOLN
20.0000 mg | Freq: Once | INTRAVENOUS | Status: AC
  Administered 2018-07-01: 20 mg via INTRAVENOUS

## 2018-07-01 MED ORDER — PALONOSETRON HCL INJECTION 0.25 MG/5ML
0.2500 mg | Freq: Once | INTRAVENOUS | Status: AC
  Administered 2018-07-01: 0.25 mg via INTRAVENOUS

## 2018-07-01 MED ORDER — SODIUM CHLORIDE 0.9 % IV SOLN
99.0000 mg/m2 | Freq: Once | INTRAVENOUS | Status: AC
  Administered 2018-07-01: 200 mg via INTRAVENOUS
  Filled 2018-07-01: qty 10

## 2018-07-01 MED ORDER — SODIUM CHLORIDE 0.9 % IV SOLN
20.0000 mg/m2 | Freq: Once | INTRAVENOUS | Status: AC
  Administered 2018-07-01: 41 mg via INTRAVENOUS
  Filled 2018-07-01: qty 41

## 2018-07-01 NOTE — Progress Notes (Signed)
Pt exhibited etoposide reaction symptoms at 1501. Etoposide stopped. Pt verbalized having trouble breathing and generalized erythema. NS running wide open at 1503 and 2L [place] placed. Sandi Mealy, PA notified of reaction. [place] at 1505. 125mg  IV solumedrol given at 1506. 20mg  IVPB pepcid given at 1507. BP 120/76, HR 102, T 98.47F, O2 96%, RR 20. 12.5mg  IV phenergan given at 1519 d/t pt c/o nausea. Pt symptoms resolved. IVF running [place] at 1521. VS at 1538: BP 121/86, HR 78, T 97.15F axillary, O2 100%, RR 16. [place] removed. Pt on room air. Report given to Ardyth Harps, RN. Discussed increased rate of etoposide with Dr. [name] pod partner, Dr. Irene Limbo. Not advised to increase rate of etoposide at this time.

## 2018-07-01 NOTE — Patient Instructions (Signed)
[place] Discharge Instructions for Patients Receiving Chemotherapy  Today you received the following chemotherapy agents: Cisplatin and Etoposide.  To help prevent nausea and vomiting after your treatment, we encourage you to take your nausea medication as prescribed.   If you develop nausea and vomiting that is not controlled by your nausea medication, call the clinic.   BELOW ARE SYMPTOMS THAT SHOULD BE REPORTED IMMEDIATELY:  *FEVER GREATER THAN 100.5 F  *CHILLS WITH OR WITHOUT FEVER  NAUSEA AND VOMITING THAT IS NOT CONTROLLED WITH YOUR NAUSEA MEDICATION  *UNUSUAL SHORTNESS OF BREATH  *UNUSUAL BRUISING OR BLEEDING  TENDERNESS IN MOUTH AND THROAT WITH OR WITHOUT PRESENCE OF ULCERS  *URINARY PROBLEMS  *BOWEL PROBLEMS  UNUSUAL RASH Items with * indicate a potential emergency and should be followed up as soon as possible.  Feel free to call the clinic should you have any questions or concerns. The clinic phone number is [phone].  Please show the [place] at check-in to the Emergency Department and triage nurse.    DO NOT USE PRESS AND SEAL ON YOUR PORT SITE ANYMORE. Try using saran wrap instead. :) It is still okay to use the numbing cream on your port site!  There is no form to take for the PFT (Pulmonary Function Test) For 4 hours before the test no caffeine, inhalers or tobacco.

## 2018-07-01 NOTE — Progress Notes (Signed)
Dr. [name] ok to treat with ANC 0.9.

## 2018-07-01 NOTE — Telephone Encounter (Signed)
Appointments scheduled AVS/Calendar printed per 7/29 los °

## 2018-07-01 NOTE — Progress Notes (Signed)
Hematology and Oncology Follow Up Visit  Greg Peterson [id number] October 15, 1996 22 y.o. 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  He developed stage IIC disease with lymphadenopathy and elevated beta-hCG.  Prior Therapy:  He is status post orchiectomy completed on January 04, 2018.  The final pathology showed pure seminoma with lymphovascular invasion.  He developed recurrent disease with 3.2 cm periaortic lymphadenopathy and elevated hCG of 121 and LDH of 367  Current therapy: BEP chemotherapy started on 05/20/2018.  He has completed 2 cycles of therapy is here to start cycle 3.  Interim History: Greg Peterson is here for a follow-up.  Since the last visit, he developed febrile reaction related to bleomycin that required emergency department visit.  Abdominal imaging at the time did not show any acute pathology and decrease in the size of his retroperitoneal adenopathy.  Since that visit, he has reported no other complaints.  He denies any fevers, chills sweats.  His appetite has been excellent and have gained weight.  He denies any delayed complications related to chemotherapy clinic peripheral neuropathy or hearing loss.  He is moving his bowels regularly and his performance status remains excellent.  He does not report any headaches, blurry vision, syncope or seizures.  He denies any alteration in mental status or dizziness.  Does not report any fevers, chills or sweats.  Does not report any cough, wheezing or hemoptysis.  Does not report any chest pain, palpitation, orthopnea or leg edema. .  Does not report any nausea, vomiting or abdominal pain.  Does not report any hematochezia or melena.   Does not report any constipation or diarrhea.  Does not report any bone pain or pathological fractures.  Does not report frequency, urgency or hematuria.  Does not report any skin rashes or  lesions.  He denies any petechia or lymphadenopathy.  Remaining review of systems is negative.    Medications: I have reviewed the patient's current medications.  Current Outpatient Medications  Medication Sig Dispense Refill  . citalopram (CELEXA) 10 MG tablet Take 10 mg by mouth daily.    Marland Kitchen HYDROcodone-acetaminophen (NORCO) 5-325 MG tablet Take 1 tablet by mouth every 6 (six) hours as needed for moderate pain. 6 tablet 0  . hydrocortisone (ANUSOL-HC) 2.5 % rectal cream Place 1 application rectally 2 (two) times daily. 30 g 2  . ibuprofen (ADVIL,MOTRIN) 200 MG tablet Take 200 mg by mouth every 6 (six) hours as needed for moderate pain.     Marland Kitchen lidocaine-prilocaine (EMLA) cream Apply 1 application topically as needed. Apply to port area 1-2 hours before coming to [facility] for treatment 30 g 1  . prochlorperazine (COMPAZINE) 10 MG tablet Take 1 tablet (10 mg total) by mouth every 6 (six) hours as needed for nausea or vomiting. 30 tablet 0  . triamcinolone cream (KENALOG) 0.1 % Apply 1 application topically 2 (two) times daily. Apply to rash twice daily until rash resolved. 45 g 1   No current facility-administered medications for this visit.      Allergies: No Known Allergies  Past Medical History, Surgical history, Social history, and Family History remained without any change in updated today.    Physical Exam:   Blood pressure 113/84, pulse 88, temperature 98.1 F (36.7 C), temperature source Oral, resp. rate 18, height 5\' 11"  (1.803 m), weight 187 lb 14.4 oz (85.2 kg), SpO2 96 %.   ECOG:  0 General appearance: Well-appearing gentleman without distress.. Head: Atraumatic without abnormalities. Oropharynx: No oral thrush or ulcers. Eyes: Sclera anicteric. Lymph nodes: No lymphadenopathy noted in the cervical, supraclavicular, axillary or supraclavicular regions. Heart:regular rate and rhythm, S1 and S2 without murmurs. Lung: clear to auscultation without any rhonchi, wheezes  or dullness. Abdomin: soft, any rebound or guarding.  No shifting dullness or ascites. Neurological: Intact motor and sensory examination. Skin: No skin rashes or lesions. Musculoskeletal: No joint deformity or effusion. Psychiatric: No changes in his mood and affect remained appropriate.    Lab Results: Lab Results  Component Value Date   WBC 4.5 06/18/2018   HGB 15.5 06/18/2018   HCT 43.6 06/18/2018   MCV 83.2 06/18/2018   PLT 169 06/18/2018     Chemistry      Component Value Date/Time   NA 136 06/18/2018 1304   K 4.5 06/18/2018 1304   CL 98 06/18/2018 1304   CO2 30 06/18/2018 1304   BUN 22 (H) 06/18/2018 1304   CREATININE 1.13 06/18/2018 1304      Component Value Date/Time   CALCIUM 9.5 06/18/2018 1304   ALKPHOS 79 06/18/2018 1304   AST 26 06/18/2018 1304   ALT 48 (H) 06/18/2018 1304   BILITOT 0.8 06/18/2018 1304       Impression and Plan:   22 year old man with:  1.  Testicular cancer diagnosed in January 2019 and developed stage IIC disease with retroperitoneal adenopathy documented in May 2019.    He completed 2 cycles of BEP chemotherapy and developed a febrile illness related to his last bleomycin dose.  Risks and benefits of continuing chemotherapy was reviewed today and I have recommended continuing same dose and schedule without bleomycin.  He is agreeable to proceed at this time and will repeat imaging studies after completing cycle 3 of therapy.   2.  IV access: Port-A-Cath veins in use without complications.  3.  Antiemetics: No nausea or vomiting reported at this time.  4.  Renal function surveillance: Creatinine remains close to normal range at this time.  5.  Pulmonary function assessment: His pulmonary function test after the last cycle of chemotherapy was reviewed and his DLCO remained stable.  I see no evidence to suggest delayed bleomycin pulmonary toxicity.  6.  Prognosis: Treatment is curative and anticipate full recovery and cure from  his malignancy upon completing chemotherapy treatment.  7.  Infusion related complications: No issues reported with etoposide at this time.  8.  Follow-up: We will be in 3 weeks after repeat imaging studies to follow his progress.  25  minutes was spent with the patient face-to-face today.  More than 50% of time was dedicated to patient counseling, education and and discussing the natural course of this disease as well as future treatment plan.    Zola Button, Peterson 7/29/20199:39 AM

## 2018-07-02 ENCOUNTER — Inpatient Hospital Stay: Payer: BLUE CROSS/BLUE SHIELD

## 2018-07-02 ENCOUNTER — Telehealth: Payer: Self-pay | Admitting: *Deleted

## 2018-07-02 VITALS — BP 121/78 | HR 94 | Temp 97.7°F | Resp 18

## 2018-07-02 DIAGNOSIS — C6290 Malignant neoplasm of unspecified testis, unspecified whether descended or undescended: Secondary | ICD-10-CM

## 2018-07-02 DIAGNOSIS — C6291 Malignant neoplasm of right testis, unspecified whether descended or undescended: Secondary | ICD-10-CM | POA: Diagnosis not present

## 2018-07-02 MED ORDER — POTASSIUM CHLORIDE 2 MEQ/ML IV SOLN
Freq: Once | INTRAVENOUS | Status: AC
  Administered 2018-07-02: 10:00:00 via INTRAVENOUS
  Filled 2018-07-02: qty 10

## 2018-07-02 MED ORDER — SODIUM CHLORIDE 0.9 % IV SOLN
20.0000 mg | Freq: Once | INTRAVENOUS | Status: AC
  Administered 2018-07-02: 20 mg via INTRAVENOUS
  Filled 2018-07-02: qty 2

## 2018-07-02 MED ORDER — HEPARIN SOD (PORK) LOCK FLUSH 100 UNIT/ML IV SOLN
500.0000 [IU] | Freq: Once | INTRAVENOUS | Status: AC | PRN
  Administered 2018-07-02: 500 [IU]
  Filled 2018-07-02: qty 5

## 2018-07-02 MED ORDER — SODIUM CHLORIDE 0.9% FLUSH
10.0000 mL | INTRAVENOUS | Status: DC | PRN
  Administered 2018-07-02: 10 mL
  Filled 2018-07-02: qty 10

## 2018-07-02 MED ORDER — DIPHENHYDRAMINE HCL 50 MG/ML IJ SOLN
50.0000 mg | Freq: Once | INTRAMUSCULAR | Status: AC
Start: 2018-07-02 — End: 2018-07-02
  Administered 2018-07-02: 50 mg via INTRAVENOUS

## 2018-07-02 MED ORDER — FAMOTIDINE IN NACL 20-0.9 MG/50ML-% IV SOLN
40.0000 mg | Freq: Once | INTRAVENOUS | Status: AC
  Administered 2018-07-02: 40 mg via INTRAVENOUS

## 2018-07-02 MED ORDER — SODIUM CHLORIDE 0.9 % IV SOLN
20.0000 mg/m2 | Freq: Once | INTRAVENOUS | Status: AC
  Administered 2018-07-02: 41 mg via INTRAVENOUS
  Filled 2018-07-02: qty 41

## 2018-07-02 MED ORDER — FAMOTIDINE IN NACL 20-0.9 MG/50ML-% IV SOLN
INTRAVENOUS | Status: AC
  Filled 2018-07-02: qty 50

## 2018-07-02 MED ORDER — DIPHENHYDRAMINE HCL 50 MG/ML IJ SOLN
INTRAMUSCULAR | Status: AC
  Filled 2018-07-02: qty 1

## 2018-07-02 MED ORDER — SODIUM CHLORIDE 0.9 % IV SOLN
Freq: Once | INTRAVENOUS | Status: AC
  Administered 2018-07-02: 10:00:00 via INTRAVENOUS
  Filled 2018-07-02: qty 250

## 2018-07-02 MED ORDER — SODIUM CHLORIDE 0.9 % IV SOLN
99.0000 mg/m2 | Freq: Once | INTRAVENOUS | Status: AC
  Administered 2018-07-02: 200 mg via INTRAVENOUS
  Filled 2018-07-02: qty 10

## 2018-07-02 NOTE — Patient Instructions (Signed)
[place] Discharge Instructions for Patients Receiving Chemotherapy  Today you received the following chemotherapy agents cisplatin,etoposide To help prevent nausea and vomiting after your treatment, we encourage you to take your nausea medication as directed   If you develop nausea and vomiting that is not controlled by your nausea medication, call the clinic.   BELOW ARE SYMPTOMS THAT SHOULD BE REPORTED IMMEDIATELY:  *FEVER GREATER THAN 100.5 F  *CHILLS WITH OR WITHOUT FEVER  NAUSEA AND VOMITING THAT IS NOT CONTROLLED WITH YOUR NAUSEA MEDICATION  *UNUSUAL SHORTNESS OF BREATH  *UNUSUAL BRUISING OR BLEEDING  TENDERNESS IN MOUTH AND THROAT WITH OR WITHOUT PRESENCE OF ULCERS  *URINARY PROBLEMS  *BOWEL PROBLEMS  UNUSUAL RASH Items with * indicate a potential emergency and should be followed up as soon as possible.  Feel free to call the clinic should you have any questions or concerns. The clinic phone number is [phone].  Please show the [place] at check-in to the Emergency Department and triage nurse.

## 2018-07-02 NOTE — Progress Notes (Signed)
    DATE:  07/01/2018                                          X CHEMO/IMMUNOTHERAPY REACTION             MD: Dr. [name]   AGENT/BLOOD PRODUCT RECEIVING TODAY:               Etoposide   AGENT/BLOOD PRODUCT RECEIVING IMMEDIATELY PRIOR TO REACTION:           Cisplatin/etoposide   VS: BP:      120/76   P:        95       SPO2:        95% on 2 L via nasal cannula T: 98.4 (axillary)                 REACTION(S):            Shortness of breath, hot flashes, flushing, and nausea     PREMEDS:     Aloxi, Benadryl 50 mg, dexamethasone 12 mg, Emend, Pepcid 40 mg   INTERVENTION: Pepcid 20 mg IV x1, Solu-Medrol 125 mg IV x1, Phenergan 12.5 mg IV x1 and supplemental oxygen 2 L via nasal cannula.   Review of Systems  Review of Systems  Constitutional: Negative for chills, diaphoresis and fever.       Hot flashes and flushing  HENT: Negative for trouble swallowing and voice change.   Respiratory: Positive for shortness of breath. Negative for cough, chest tightness and wheezing.   Cardiovascular: Negative for chest pain and palpitations.  Gastrointestinal: Positive for nausea. Negative for abdominal pain, constipation, diarrhea and vomiting.  Musculoskeletal: Negative for back pain and myalgias.  Neurological: Negative for dizziness, light-headedness and headaches.     Physical Exam  Physical Exam  Constitutional: No distress.  HENT:  Head: Normocephalic and atraumatic.  Mild erythema which resolved after intervention.  Cardiovascular: Normal rate, regular rhythm and normal heart sounds. Exam reveals no gallop and no friction rub.  No murmur heard. Pulmonary/Chest: Effort normal and breath sounds normal. No respiratory distress. He has no wheezes. He has no rales.  Neurological: He is alert.  Skin: Skin is warm and dry. No rash noted. He is not diaphoretic. No erythema.    OUTCOME:                   Sandi Mealy, MHS, PA-C

## 2018-07-02 NOTE — Telephone Encounter (Signed)
Patient's mother Suzi Roots calling. States CT scan needs to be at [facility]. Her insurance will not cover it if done at [facility]. [facility] [place] to [facility].

## 2018-07-03 ENCOUNTER — Inpatient Hospital Stay: Payer: BLUE CROSS/BLUE SHIELD

## 2018-07-03 VITALS — BP 130/82 | HR 92 | Temp 98.4°F | Resp 18

## 2018-07-03 DIAGNOSIS — C6290 Malignant neoplasm of unspecified testis, unspecified whether descended or undescended: Secondary | ICD-10-CM

## 2018-07-03 DIAGNOSIS — C6291 Malignant neoplasm of right testis, unspecified whether descended or undescended: Secondary | ICD-10-CM | POA: Diagnosis not present

## 2018-07-03 MED ORDER — SODIUM CHLORIDE 0.9 % IV SOLN
200.0000 mg | Freq: Once | INTRAVENOUS | Status: AC
  Administered 2018-07-03: 200 mg via INTRAVENOUS
  Filled 2018-07-03: qty 10

## 2018-07-03 MED ORDER — SODIUM CHLORIDE 0.9% FLUSH
10.0000 mL | INTRAVENOUS | Status: DC | PRN
  Administered 2018-07-03: 10 mL
  Filled 2018-07-03: qty 10

## 2018-07-03 MED ORDER — FAMOTIDINE IN NACL 20-0.9 MG/50ML-% IV SOLN
40.0000 mg | Freq: Once | INTRAVENOUS | Status: AC
  Administered 2018-07-03: 40 mg via INTRAVENOUS

## 2018-07-03 MED ORDER — PALONOSETRON HCL INJECTION 0.25 MG/5ML
INTRAVENOUS | Status: AC
Start: 2018-07-03 — End: ?
  Filled 2018-07-03: qty 5

## 2018-07-03 MED ORDER — DIPHENHYDRAMINE HCL 50 MG/ML IJ SOLN
50.0000 mg | Freq: Once | INTRAMUSCULAR | Status: AC
  Administered 2018-07-03: 50 mg via INTRAVENOUS

## 2018-07-03 MED ORDER — HEPARIN SOD (PORK) LOCK FLUSH 100 UNIT/ML IV SOLN
500.0000 [IU] | Freq: Once | INTRAVENOUS | Status: AC | PRN
  Administered 2018-07-03: 500 [IU]
  Filled 2018-07-03: qty 5

## 2018-07-03 MED ORDER — POTASSIUM CHLORIDE 2 MEQ/ML IV SOLN
Freq: Once | INTRAVENOUS | Status: AC
  Administered 2018-07-03: 10:00:00 via INTRAVENOUS
  Filled 2018-07-03: qty 10

## 2018-07-03 MED ORDER — SODIUM CHLORIDE 0.9 % IV SOLN
Freq: Once | INTRAVENOUS | Status: AC
  Administered 2018-07-03: 11:00:00 via INTRAVENOUS
  Filled 2018-07-03: qty 5

## 2018-07-03 MED ORDER — SODIUM CHLORIDE 0.9 % IV SOLN
Freq: Once | INTRAVENOUS | Status: AC
  Administered 2018-07-03: 09:00:00 via INTRAVENOUS
  Filled 2018-07-03: qty 250

## 2018-07-03 MED ORDER — FAMOTIDINE IN NACL 20-0.9 MG/50ML-% IV SOLN
INTRAVENOUS | Status: AC
  Filled 2018-07-03: qty 50

## 2018-07-03 MED ORDER — SODIUM CHLORIDE 0.9 % IV SOLN
20.0000 mg/m2 | Freq: Once | INTRAVENOUS | Status: AC
  Administered 2018-07-03: 41 mg via INTRAVENOUS
  Filled 2018-07-03: qty 41

## 2018-07-03 MED ORDER — PALONOSETRON HCL INJECTION 0.25 MG/5ML
0.2500 mg | Freq: Once | INTRAVENOUS | Status: AC
  Administered 2018-07-03: 0.25 mg via INTRAVENOUS

## 2018-07-03 MED ORDER — DIPHENHYDRAMINE HCL 50 MG/ML IJ SOLN
INTRAMUSCULAR | Status: AC
  Filled 2018-07-03: qty 1

## 2018-07-03 NOTE — Patient Instructions (Signed)
[place] Discharge Instructions for Patients Receiving Chemotherapy  Today you received the following chemotherapy agents: Cisplatin and Etoposide.  To help prevent nausea and vomiting after your treatment, we encourage you to take your nausea medication as prescribed.   If you develop nausea and vomiting that is not controlled by your nausea medication, call the clinic.   BELOW ARE SYMPTOMS THAT SHOULD BE REPORTED IMMEDIATELY:  *FEVER GREATER THAN 100.5 F  *CHILLS WITH OR WITHOUT FEVER  NAUSEA AND VOMITING THAT IS NOT CONTROLLED WITH YOUR NAUSEA MEDICATION  *UNUSUAL SHORTNESS OF BREATH  *UNUSUAL BRUISING OR BLEEDING  TENDERNESS IN MOUTH AND THROAT WITH OR WITHOUT PRESENCE OF ULCERS  *URINARY PROBLEMS  *BOWEL PROBLEMS  UNUSUAL RASH Items with * indicate a potential emergency and should be followed up as soon as possible.  Feel free to call the clinic should you have any questions or concerns. The clinic phone number is [phone].  Please show the [place] at check-in to the Emergency Department and triage nurse.    DO NOT USE PRESS AND SEAL ON YOUR PORT SITE ANYMORE. Try using saran wrap instead. :) It is still okay to use the numbing cream on your port site!  There is no form to take for the PFT (Pulmonary Function Test) For 4 hours before the test no caffeine, inhalers or tobacco.

## 2018-07-03 NOTE — Progress Notes (Signed)
RN spoke with patient, he did well with yesterday's etoposide infusion (no reaction) with the [employer] manufacturer. Updated this information in patient's treatment plan.   Demetrius Charity, PharmD PGY2 Oncology Pharmacy Resident  Pharmacy Phone: [phone] 07/03/2018

## 2018-07-04 ENCOUNTER — Inpatient Hospital Stay: Payer: BLUE CROSS/BLUE SHIELD | Attending: Oncology

## 2018-07-04 VITALS — BP 138/84 | HR 76 | Temp 98.8°F | Resp 16

## 2018-07-04 DIAGNOSIS — C6290 Malignant neoplasm of unspecified testis, unspecified whether descended or undescended: Secondary | ICD-10-CM

## 2018-07-04 DIAGNOSIS — C6291 Malignant neoplasm of right testis, unspecified whether descended or undescended: Secondary | ICD-10-CM | POA: Insufficient documentation

## 2018-07-04 DIAGNOSIS — Z5111 Encounter for antineoplastic chemotherapy: Secondary | ICD-10-CM | POA: Insufficient documentation

## 2018-07-04 MED ORDER — SODIUM CHLORIDE 0.9% FLUSH
10.0000 mL | INTRAVENOUS | Status: DC | PRN
  Administered 2018-07-04: 10 mL
  Filled 2018-07-04: qty 10

## 2018-07-04 MED ORDER — SODIUM CHLORIDE 0.9 % IV SOLN
98.0000 mg/m2 | Freq: Once | INTRAVENOUS | Status: AC
  Administered 2018-07-04: 200 mg via INTRAVENOUS
  Filled 2018-07-04: qty 10

## 2018-07-04 MED ORDER — PALONOSETRON HCL INJECTION 0.25 MG/5ML
INTRAVENOUS | Status: AC
  Filled 2018-07-04: qty 5

## 2018-07-04 MED ORDER — POTASSIUM CHLORIDE 2 MEQ/ML IV SOLN
Freq: Once | INTRAVENOUS | Status: AC
  Administered 2018-07-04: 10:00:00 via INTRAVENOUS
  Filled 2018-07-04: qty 10

## 2018-07-04 MED ORDER — DIPHENHYDRAMINE HCL 50 MG/ML IJ SOLN
50.0000 mg | Freq: Once | INTRAMUSCULAR | Status: AC
  Administered 2018-07-04: 50 mg via INTRAVENOUS

## 2018-07-04 MED ORDER — SODIUM CHLORIDE 0.9 % IV SOLN
Freq: Once | INTRAVENOUS | Status: AC
  Administered 2018-07-04: 09:00:00 via INTRAVENOUS
  Filled 2018-07-04: qty 250

## 2018-07-04 MED ORDER — FAMOTIDINE IN NACL 20-0.9 MG/50ML-% IV SOLN
40.0000 mg | Freq: Once | INTRAVENOUS | Status: AC
  Administered 2018-07-04: 40 mg via INTRAVENOUS

## 2018-07-04 MED ORDER — DIPHENHYDRAMINE HCL 50 MG/ML IJ SOLN
INTRAMUSCULAR | Status: AC
  Filled 2018-07-04: qty 1

## 2018-07-04 MED ORDER — SODIUM CHLORIDE 0.9 % IV SOLN
20.0000 mg/m2 | Freq: Once | INTRAVENOUS | Status: AC
  Administered 2018-07-04: 41 mg via INTRAVENOUS
  Filled 2018-07-04: qty 41

## 2018-07-04 MED ORDER — FAMOTIDINE IN NACL 20-0.9 MG/50ML-% IV SOLN
INTRAVENOUS | Status: AC
  Filled 2018-07-04: qty 50

## 2018-07-04 MED ORDER — DEXAMETHASONE SODIUM PHOSPHATE 100 MG/10ML IJ SOLN
20.0000 mg | Freq: Once | INTRAMUSCULAR | Status: AC
  Administered 2018-07-04: 20 mg via INTRAVENOUS
  Filled 2018-07-04: qty 2

## 2018-07-04 MED ORDER — HEPARIN SOD (PORK) LOCK FLUSH 100 UNIT/ML IV SOLN
500.0000 [IU] | Freq: Once | INTRAVENOUS | Status: AC | PRN
Start: 2018-07-04 — End: 2018-07-04
  Administered 2018-07-04: 500 [IU]
  Filled 2018-07-04: qty 5

## 2018-07-04 NOTE — Patient Instructions (Signed)
[place] Discharge Instructions for Patients Receiving Chemotherapy  Today you received the following chemotherapy agents: Cisplatin and Etoposide.  To help prevent nausea and vomiting after your treatment, we encourage you to take your nausea medication as prescribed.   If you develop nausea and vomiting that is not controlled by your nausea medication, call the clinic.   BELOW ARE SYMPTOMS THAT SHOULD BE REPORTED IMMEDIATELY:  *FEVER GREATER THAN 100.5 F  *CHILLS WITH OR WITHOUT FEVER  NAUSEA AND VOMITING THAT IS NOT CONTROLLED WITH YOUR NAUSEA MEDICATION  *UNUSUAL SHORTNESS OF BREATH  *UNUSUAL BRUISING OR BLEEDING  TENDERNESS IN MOUTH AND THROAT WITH OR WITHOUT PRESENCE OF ULCERS  *URINARY PROBLEMS  *BOWEL PROBLEMS  UNUSUAL RASH Items with * indicate a potential emergency and should be followed up as soon as possible.  Feel free to call the clinic should you have any questions or concerns. The clinic phone number is [phone].  Please show the [place] at check-in to the Emergency Department and triage nurse.    DO NOT USE PRESS AND SEAL ON YOUR PORT SITE ANYMORE. Try using saran wrap instead. :) It is still okay to use the numbing cream on your port site!  There is no form to take for the PFT (Pulmonary Function Test) For 4 hours before the test no caffeine, inhalers or tobacco.

## 2018-07-05 ENCOUNTER — Inpatient Hospital Stay: Payer: BLUE CROSS/BLUE SHIELD

## 2018-07-05 DIAGNOSIS — C6291 Malignant neoplasm of right testis, unspecified whether descended or undescended: Secondary | ICD-10-CM | POA: Diagnosis not present

## 2018-07-05 DIAGNOSIS — C6290 Malignant neoplasm of unspecified testis, unspecified whether descended or undescended: Secondary | ICD-10-CM

## 2018-07-05 MED ORDER — DIPHENHYDRAMINE HCL 50 MG/ML IJ SOLN
INTRAMUSCULAR | Status: AC
  Filled 2018-07-05: qty 1

## 2018-07-05 MED ORDER — SODIUM CHLORIDE 0.9% FLUSH
10.0000 mL | INTRAVENOUS | Status: DC | PRN
  Administered 2018-07-05: 10 mL
  Filled 2018-07-05: qty 10

## 2018-07-05 MED ORDER — PALONOSETRON HCL INJECTION 0.25 MG/5ML
INTRAVENOUS | Status: AC
  Filled 2018-07-05: qty 5

## 2018-07-05 MED ORDER — FAMOTIDINE IN NACL 20-0.9 MG/50ML-% IV SOLN
INTRAVENOUS | Status: AC
  Filled 2018-07-05: qty 50

## 2018-07-05 MED ORDER — PALONOSETRON HCL INJECTION 0.25 MG/5ML
0.2500 mg | Freq: Once | INTRAVENOUS | Status: AC
  Administered 2018-07-05: 0.25 mg via INTRAVENOUS

## 2018-07-05 MED ORDER — HEPARIN SOD (PORK) LOCK FLUSH 100 UNIT/ML IV SOLN
500.0000 [IU] | Freq: Once | INTRAVENOUS | Status: AC | PRN
  Administered 2018-07-05: 500 [IU]
  Filled 2018-07-05: qty 5

## 2018-07-05 MED ORDER — ETOPOSIDE CHEMO INJECTION 1 GM/50ML
98.0000 mg/m2 | Freq: Once | INTRAVENOUS | Status: AC
  Administered 2018-07-05: 200 mg via INTRAVENOUS
  Filled 2018-07-05: qty 10

## 2018-07-05 MED ORDER — FOSAPREPITANT DIMEGLUMINE INJECTION 150 MG
Freq: Once | INTRAVENOUS | Status: AC
  Administered 2018-07-05: 13:00:00 via INTRAVENOUS
  Filled 2018-07-05: qty 5

## 2018-07-05 MED ORDER — FAMOTIDINE IN NACL 20-0.9 MG/50ML-% IV SOLN
40.0000 mg | Freq: Once | INTRAVENOUS | Status: AC
  Administered 2018-07-05: 40 mg via INTRAVENOUS

## 2018-07-05 MED ORDER — CISPLATIN CHEMO INJECTION 100MG/100ML
20.0000 mg/m2 | Freq: Once | INTRAVENOUS | Status: AC
  Administered 2018-07-05: 41 mg via INTRAVENOUS
  Filled 2018-07-05: qty 41

## 2018-07-05 MED ORDER — SODIUM CHLORIDE 0.9 % IV SOLN
Freq: Once | INTRAVENOUS | Status: AC
  Administered 2018-07-05: 10:00:00 via INTRAVENOUS
  Filled 2018-07-05: qty 250

## 2018-07-05 MED ORDER — DIPHENHYDRAMINE HCL 50 MG/ML IJ SOLN
50.0000 mg | Freq: Once | INTRAMUSCULAR | Status: AC
  Administered 2018-07-05: 50 mg via INTRAVENOUS

## 2018-07-05 MED ORDER — POTASSIUM CHLORIDE 2 MEQ/ML IV SOLN
Freq: Once | INTRAVENOUS | Status: AC
  Administered 2018-07-05: 10:00:00 via INTRAVENOUS
  Filled 2018-07-05: qty 10

## 2018-07-05 NOTE — Patient Instructions (Signed)
[facility] Discharge Instructions for Patients Receiving Chemotherapy  Today you received the following chemotherapy agents:  Etoposide, Cisplatin  To help prevent nausea and vomiting after your treatment, we encourage you to take your nausea medication as prescribed.   If you develop nausea and vomiting that is not controlled by your nausea medication, call the clinic.   BELOW ARE SYMPTOMS THAT SHOULD BE REPORTED IMMEDIATELY:  *FEVER GREATER THAN 100.5 F  *CHILLS WITH OR WITHOUT FEVER  NAUSEA AND VOMITING THAT IS NOT CONTROLLED WITH YOUR NAUSEA MEDICATION  *UNUSUAL SHORTNESS OF BREATH  *UNUSUAL BRUISING OR BLEEDING  TENDERNESS IN MOUTH AND THROAT WITH OR WITHOUT PRESENCE OF ULCERS  *URINARY PROBLEMS  *BOWEL PROBLEMS  UNUSUAL RASH Items with * indicate a potential emergency and should be followed up as soon as possible.  Feel free to call the clinic should you have any questions or concerns. The clinic phone number is [phone].  Please show the CHEMO ALERT CARD at check-in to the Emergency Department and triage nurse.   

## 2018-07-05 NOTE — Progress Notes (Signed)
Per Henreitta Leber, RPh  Ok to run etoposide over one hour rather than 2 hours.  Elray Buba, RN notified

## 2018-07-09 ENCOUNTER — Other Ambulatory Visit: Payer: BLUE CROSS/BLUE SHIELD

## 2018-07-09 ENCOUNTER — Ambulatory Visit: Payer: BLUE CROSS/BLUE SHIELD

## 2018-07-15 ENCOUNTER — Ambulatory Visit
Admission: RE | Admit: 2018-07-15 | Discharge: 2018-07-15 | Disposition: A | Discharge status: Home / Self Care | Payer: BLUE CROSS/BLUE SHIELD | Source: Ambulatory Visit | Attending: Oncology | Admitting: Oncology

## 2018-07-15 DIAGNOSIS — C6290 Malignant neoplasm of unspecified testis, unspecified whether descended or undescended: Secondary | ICD-10-CM

## 2018-07-15 MED ORDER — IOPAMIDOL (ISOVUE-300) INJECTION 61%
100.0000 mL | Freq: Once | INTRAVENOUS | Status: AC | PRN
  Administered 2018-07-15: 100 mL via INTRAVENOUS

## 2018-07-16 ENCOUNTER — Ambulatory Visit: Payer: BLUE CROSS/BLUE SHIELD | Admitting: Oncology

## 2018-07-16 ENCOUNTER — Ambulatory Visit: Payer: BLUE CROSS/BLUE SHIELD

## 2018-07-16 ENCOUNTER — Other Ambulatory Visit: Payer: BLUE CROSS/BLUE SHIELD

## 2018-07-17 ENCOUNTER — Inpatient Hospital Stay: Payer: BLUE CROSS/BLUE SHIELD

## 2018-07-17 ENCOUNTER — Other Ambulatory Visit: Payer: BLUE CROSS/BLUE SHIELD

## 2018-07-17 ENCOUNTER — Ambulatory Visit (HOSPITAL_COMMUNITY): Payer: BLUE CROSS/BLUE SHIELD

## 2018-07-17 DIAGNOSIS — C6290 Malignant neoplasm of unspecified testis, unspecified whether descended or undescended: Secondary | ICD-10-CM

## 2018-07-17 DIAGNOSIS — Z95828 Presence of other vascular implants and grafts: Secondary | ICD-10-CM

## 2018-07-17 DIAGNOSIS — C6291 Malignant neoplasm of right testis, unspecified whether descended or undescended: Secondary | ICD-10-CM | POA: Diagnosis not present

## 2018-07-17 LAB — CBC WITH DIFFERENTIAL (CANCER CENTER ONLY)
BASOS PCT: 5 %
Basophils Absolute: 0.1 10*3/uL (ref 0.0–0.1)
EOS ABS: 0.1 10*3/uL (ref 0.0–0.5)
EOS PCT: 6 %
HCT: 35.3 % — ABNORMAL LOW (ref 38.4–49.9)
HEMOGLOBIN: 12.1 g/dL — AB (ref 13.0–17.1)
Lymphocytes Relative: 67 %
Lymphs Abs: 1.2 10*3/uL (ref 0.9–3.3)
MCH: 29.1 pg (ref 27.2–33.4)
MCHC: 34.3 g/dL (ref 32.0–36.0)
MCV: 84.7 fL (ref 79.3–98.0)
MONOS PCT: 15 %
Monocytes Absolute: 0.3 10*3/uL (ref 0.1–0.9)
NEUTROS PCT: 7 %
Neutro Abs: 0.1 10*3/uL — CL (ref 1.5–6.5)
PLATELETS: 179 10*3/uL (ref 140–400)
RBC: 4.16 MIL/uL — ABNORMAL LOW (ref 4.20–5.82)
RDW: 14.4 % (ref 11.0–14.6)
WBC: 1.8 10*3/uL — AB (ref 4.0–10.3)

## 2018-07-17 LAB — CMP (CANCER CENTER ONLY)
ALBUMIN: 3.8 g/dL (ref 3.5–5.0)
ALT: 29 U/L (ref 0–44)
ANION GAP: 11 (ref 5–15)
AST: 15 U/L (ref 15–41)
Alkaline Phosphatase: 76 U/L (ref 38–126)
BILIRUBIN TOTAL: 0.7 mg/dL (ref 0.3–1.2)
BUN: 12 mg/dL (ref 6–20)
CHLORIDE: 109 mmol/L (ref 98–111)
CO2: 23 mmol/L (ref 22–32)
Calcium: 9.2 mg/dL (ref 8.9–10.3)
Creatinine: 0.96 mg/dL (ref 0.61–1.24)
GFR, Est AFR Am: >60 mL/min (ref >60)
Glucose, Bld: 92 mg/dL (ref 70–99)
POTASSIUM: 3.9 mmol/L (ref 3.5–5.1)
Sodium: 143 mmol/L (ref 135–145)
TOTAL PROTEIN: 7 g/dL (ref 6.5–8.1)

## 2018-07-17 LAB — MAGNESIUM: MAGNESIUM: 1.6 mg/dL — AB (ref 1.7–2.4)

## 2018-07-17 LAB — LACTATE DEHYDROGENASE: LDH: 195 U/L — AB (ref 98–192)

## 2018-07-17 MED ORDER — SODIUM CHLORIDE 0.9% FLUSH
10.0000 mL | Freq: Once | INTRAVENOUS | Status: AC
  Administered 2018-07-17: 10 mL
  Filled 2018-07-17: qty 10

## 2018-07-17 MED ORDER — HEPARIN SOD (PORK) LOCK FLUSH 100 UNIT/ML IV SOLN
500.0000 [IU] | Freq: Once | INTRAVENOUS | Status: AC
  Administered 2018-07-17: 500 [IU]
  Filled 2018-07-17: qty 5

## 2018-07-18 LAB — BETA HCG QUANT (REF LAB)

## 2018-07-18 LAB — AFP TUMOR MARKER: AFP, Serum, Tumor Marker: 4.9 ng/mL (ref 0.0–8.3)

## 2018-07-22 ENCOUNTER — Other Ambulatory Visit: Payer: BLUE CROSS/BLUE SHIELD

## 2018-07-22 ENCOUNTER — Ambulatory Visit: Payer: BLUE CROSS/BLUE SHIELD

## 2018-07-23 ENCOUNTER — Inpatient Hospital Stay (HOSPITAL_BASED_OUTPATIENT_CLINIC_OR_DEPARTMENT_OTHER): Payer: BLUE CROSS/BLUE SHIELD | Admitting: Oncology

## 2018-07-23 ENCOUNTER — Ambulatory Visit: Payer: BLUE CROSS/BLUE SHIELD

## 2018-07-23 ENCOUNTER — Telehealth: Payer: Self-pay | Admitting: Oncology

## 2018-07-23 DIAGNOSIS — C6290 Malignant neoplasm of unspecified testis, unspecified whether descended or undescended: Secondary | ICD-10-CM

## 2018-07-23 DIAGNOSIS — C6291 Malignant neoplasm of right testis, unspecified whether descended or undescended: Secondary | ICD-10-CM

## 2018-07-23 DIAGNOSIS — Z9079 Acquired absence of other genital organ(s): Secondary | ICD-10-CM | POA: Diagnosis not present

## 2018-07-23 NOTE — Progress Notes (Signed)
Hematology and Oncology Follow Up Visit  Greg Peterson [id number] 1996-07-02 22 y.o. 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  He developed lymphadenopathy and elevated beta-hCG 121.  Prior Therapy:  He is status post orchiectomy completed on January 04, 2018.  The final pathology showed pure seminoma with lymphovascular invasion.  He developed recurrent disease with 3.2 cm periaortic lymphadenopathy and elevated hCG of 121 and LDH of 367 in May 2019.  Current therapy: BEP chemotherapy started on 05/20/2018.  He has completed 3 cycles of therapy on July 05, 2018.  Interim History: Mr. Greenstreet presents today for a follow-up.  Since the last visit,  He does not report any headaches, blurry vision, syncope or seizures.  He denies any dizziness or confusion.  Does not report any fevers, chills or sweats.  Does not report any cough, wheezing or hemoptysis.  Does not report any chest pain, palpitation, orthopnea or leg edema. .  Does not report any nausea, vomiting or abdominal pain.  Does not report any change in his bowel habits. Does not report any arthralgias or myalgias.  Does not report frequency, urgency or hematuria.  Does not report any skin rashes or lesions.  He denies any bleeding or clotting tendencies.  Remaining review of systems is negative.    Medications: I have reviewed the patient's current medications.  Current Outpatient Medications  Medication Sig Dispense Refill  . citalopram (CELEXA) 10 MG tablet Take 10 mg by mouth daily.    Marland Kitchen HYDROcodone-acetaminophen (NORCO) 5-325 MG tablet Take 1 tablet by mouth every 6 (six) hours as needed for moderate pain. 6 tablet 0  . hydrocortisone (ANUSOL-HC) 2.5 % rectal cream Place 1 application rectally 2 (two) times daily. 30 g 2  . ibuprofen (ADVIL,MOTRIN) 200 MG tablet Take 200 mg by mouth every 6 (six) hours as  needed for moderate pain.     Marland Kitchen lidocaine-prilocaine (EMLA) cream Apply 1 application topically as needed. Apply to port area 1-2 hours before coming to [facility] for treatment 30 g 1  . prochlorperazine (COMPAZINE) 10 MG tablet Take 1 tablet (10 mg total) by mouth every 6 (six) hours as needed for nausea or vomiting. 30 tablet 0  . triamcinolone cream (KENALOG) 0.1 % Apply 1 application topically 2 (two) times daily. Apply to rash twice daily until rash resolved. 45 g 1   No current facility-administered medications for this visit.      Allergies: No Known Allergies  Past Medical History, Surgical history, Social history, and Family History remained without any change in updated today.    Physical Exam:     ECOG: 0 General appearance: Alert, awake gentleman without distress. Head: Normocephalic without abnormalities. Oropharynx: Mucous membranes are moist and pink. Eyes: Pupils are equal and round reactive to light. Lymph nodes: No cervical, supraclavicular, axillary or supraclavicular lymphadenopathy. Heart:regular rate and rhythm, without any murmurs or gallops.  No leg edema. Lung: clear without any rhonchi, wheezes or dullness to percussion. Abdomin: soft, without any rebound or guarding.  No shifting dullness or ascites. Neurological: no motor or sensory deficits. Skin: No ecchymosis or petechiae. Musculoskeletal: No clubbing or cyanosis.     Lab Results: Lab Results  Component Value Date   WBC 1.8 (L) 07/17/2018   HGB 12.1 (L) 07/17/2018   HCT 35.3 (L) 07/17/2018   MCV 84.7 07/17/2018   PLT 179 07/17/2018  Chemistry      Component Value Date/Time   NA 143 07/17/2018 1129   K 3.9 07/17/2018 1129   CL 109 07/17/2018 1129   CO2 23 07/17/2018 1129   BUN 12 07/17/2018 1129   CREATININE 0.96 07/17/2018 1129      Component Value Date/Time   CALCIUM 9.2 07/17/2018 1129   ALKPHOS 76 07/17/2018 1129   AST 15 07/17/2018 1129   ALT 29 07/17/2018 1129    BILITOT 0.7 07/17/2018 1129      Results for [name], [name] (MRN [id number]) as of 07/23/2018 14:29  Ref. Range 04/24/2018 11:06 06/04/2018 14:22 07/17/2018 11:29  LDH Latest Ref Range: 98 - 192 U/L 367 (H) 227 (H) 195 (H)   Results for Peterson, [name] (MRN [id number]) as of 07/23/2018 14:29  Ref. Range 04/24/2018 11:06 06/04/2018 14:22 06/10/2018 08:15 07/17/2018 11:29  hCG Quant Latest Ref Range: 0 - 3 mIU/mL 121 (H) 12 (H)  <1   EXAM: CT CHEST, ABDOMEN, AND PELVIS WITH CONTRAST  TECHNIQUE: Multidetector CT imaging of the chest, abdomen and pelvis was performed following the standard protocol during bolus administration of intravenous contrast.  CONTRAST:  138mL ISOVUE-300 IOPAMIDOL (ISOVUE-300) INJECTION 61%  COMPARISON:  Abdominopelvic CT 06/19/2018 and 04/17/2018.  FINDINGS: CT CHEST FINDINGS  Cardiovascular: No significant vascular findings. Right IJ Port-A-Cath extends to the superior cavoatrial junction. The heart size is normal. There is no pericardial effusion.  Mediastinum/Nodes: There are no enlarged mediastinal, hilar or axillary lymph nodes. The thyroid gland, trachea and esophagus demonstrate no significant findings.  Lungs/Pleura: There is no pleural effusion. The lungs are clear.  Musculoskeletal/Chest wall: No chest wall mass or suspicious osseous findings. Probable tiny bone island in the sternal manubrium.  CT ABDOMEN AND PELVIS FINDINGS  Hepatobiliary: The liver is normal in density without focal abnormality. No evidence of gallstones, gallbladder wall thickening or biliary dilatation.  Pancreas: There is stable fullness of the pancreatic head and uncinate process without associated ductal dilatation or surrounding inflammation. This is considered a normal variant based on stability.  Spleen: Normal in size without focal abnormality.  Adrenals/Urinary Tract: Both adrenal glands appear normal. The kidneys appear normal without evidence of  urinary tract calculus, suspicious lesion or hydronephrosis. No bladder abnormalities are seen.  Stomach/Bowel: No evidence of bowel wall thickening, distention or surrounding inflammatory change. The appendix appears normal.  Vascular/Lymphatic: 2.5 x 1.8 cm low-density aortocaval node on image 80/2 appears slightly smaller. There are no new or enlarging abdominopelvic lymph nodes. No significant vascular findings.  Reproductive: The prostate gland and seminal vesicles appear normal. Previous right orchiectomy.  Other: No evidence of abdominal wall mass or hernia. No ascites.  Musculoskeletal: No acute or significant osseous findings. Stable scattered bone islands in the pelvis.  IMPRESSION: 1. Slight improvement in retroperitoneal lymphadenopathy. No disease progression identified. 2. No significant findings in the chest. 3. No acute findings in the chest, abdomen or pelvis.    Impression and Plan:   22 year old man with:  1.  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  He is status post orchiectomy and developed stage IIC disease with retroperitoneal adenopathy documented in May 2019.  He had an elevated beta-hCG of 121 and LDH of 367.  He completed 3 cycles of BEP chemotherapy with bleomycin omitted the last cycle because of poor tolerance.  His CT scan on 07/15/2018 was personally reviewed and showed continuous regression of his retroperitoneal adenopathy although he continued to have residual mass measuring 2.5 x 1.8 cm  in the aortocaval node.  His tumor markers including LDH and beta hCG has normalized.  The differential diagnosis for his residual mass was discussed today.  Given the fact that it is less than 3 cm, this represents statistically benign findings although there is a chance of residual tumor at this time.  I recommended repeating PET CT scan in the next 2 months and potentially consider retroperitoneal lymph node dissection if there is any  residual tumor suspected based on the PET imaging.  Is agreeable with this plan at this time.   2.  IV access: Port-A-Cath will be flushed every 2 months and will consider removal if his imaging studies in the future showed no residual malignancy.   3.  Pulmonary function assessment: No respiratory complaints upon completing chemotherapy from bleomycin.   4.  Follow-up: We will be in 2 months to repeat imaging studies and repeat laboratory testing.  15  minutes was spent with the patient face-to-face today.  More than 50% of time was dedicated to reviewing imaging studies, laboratory data and discussing the natural course of his disease.  I also discussed future care plan.     Zola Button, MD 8/20/20193:31 PM

## 2018-07-23 NOTE — Telephone Encounter (Signed)
Appts scheduled AVS/Calendar printed per 8/20 los °

## 2018-07-24 ENCOUNTER — Ambulatory Visit: Payer: BLUE CROSS/BLUE SHIELD

## 2018-07-25 ENCOUNTER — Ambulatory Visit: Payer: BLUE CROSS/BLUE SHIELD

## 2018-07-26 ENCOUNTER — Ambulatory Visit: Payer: BLUE CROSS/BLUE SHIELD

## 2018-07-30 ENCOUNTER — Ambulatory Visit: Payer: BLUE CROSS/BLUE SHIELD

## 2018-08-06 ENCOUNTER — Ambulatory Visit: Payer: BLUE CROSS/BLUE SHIELD

## 2018-09-16 ENCOUNTER — Telehealth: Payer: Self-pay

## 2018-09-16 ENCOUNTER — Telehealth: Payer: Self-pay | Admitting: *Deleted

## 2018-09-16 DIAGNOSIS — Z95828 Presence of other vascular implants and grafts: Secondary | ICD-10-CM

## 2018-09-16 DIAGNOSIS — C6290 Malignant neoplasm of unspecified testis, unspecified whether descended or undescended: Secondary | ICD-10-CM

## 2018-09-16 NOTE — Telephone Encounter (Signed)
"  Mack Hook RN, CM [phone]) calling to request patient be seen.  Family report port-a-cath site red with a bump on it and numbness to feet.  I do not know if he has fever, when port was used last but port scheduled to be flushed 09-24-2018.  He may have picked at the port-a-cath area. "   Called to assess further.  Message left requesting return call.

## 2018-09-16 NOTE — Telephone Encounter (Signed)
Spoke with Greg Peterson's father Greg Peterson [phone]) to further evaluate right chest port-a-cath and numbness.     "Greg noticed numbness in his feet for a few days.  Saw  PCP who prescribed Gabapentin 300 mg nightly October 1st which he started that night.  PCP will check labs the next time he's seen to make sure he's tolerating Gabapentin.  No trouble walking.    Red line (incision scar) in the middle of the port-a-cath inserted in June.  Bumps are under the skin.    No fever, swelling or warmth to right chest port-a-cath.  I have heard him say he feels something with the port-a-cath.  I was an hour away when they called so they may have made him nervous."  Greg expressed being "concerned about vibrating numbness in my hands and feet.  It's kind of okay today.  I've heard people say they have numbness for three months after treatment.  Is this a side effect?  It's been a long time since I had treatment.  No redness, swelling or pain.  Feeling a little bit of stinging at my port-a-cath when I move.    There's another small knot under the skin above the knot on the port-a-cath makes me wonder if this is a clot or something."  Expecting call with provider instructions or orders.  Otherwise will report as scheduled. Signs of infection reviewed with instructions to call at any time.  Avoid hot water baths/showers, friction to hands and feet, keeping skin moisturized to not worsen numbness to hands and feet.

## 2018-09-16 NOTE — Telephone Encounter (Signed)
Duplicate. Nastassia, LPN already called.

## 2018-09-16 NOTE — Telephone Encounter (Signed)
Spoke with patient's father stated patient has numbness in feet. Patient's PCP prescribed Gabapentin he been taking since 09/03/18.  Patient noted a small bump palpated about a quarter of an inch above the port. No pain, redness, tenderness to touch, or fever noted. Advised will make MD aware- patient's father verbalized understanding.

## 2018-09-17 ENCOUNTER — Inpatient Hospital Stay (HOSPITAL_BASED_OUTPATIENT_CLINIC_OR_DEPARTMENT_OTHER): Payer: BLUE CROSS/BLUE SHIELD | Admitting: Medical

## 2018-09-17 ENCOUNTER — Inpatient Hospital Stay: Payer: BLUE CROSS/BLUE SHIELD

## 2018-09-17 ENCOUNTER — Other Ambulatory Visit: Payer: Self-pay

## 2018-09-17 ENCOUNTER — Inpatient Hospital Stay: Payer: BLUE CROSS/BLUE SHIELD | Attending: Oncology

## 2018-09-17 VITALS — BP 127/89 | HR 84 | Temp 97.9°F | Resp 18 | Ht 71.0 in | Wt 198.7 lb

## 2018-09-17 DIAGNOSIS — R202 Paresthesia of skin: Secondary | ICD-10-CM

## 2018-09-17 DIAGNOSIS — Z95828 Presence of other vascular implants and grafts: Secondary | ICD-10-CM

## 2018-09-17 DIAGNOSIS — C6291 Malignant neoplasm of right testis, unspecified whether descended or undescended: Secondary | ICD-10-CM | POA: Insufficient documentation

## 2018-09-17 DIAGNOSIS — G629 Polyneuropathy, unspecified: Secondary | ICD-10-CM | POA: Insufficient documentation

## 2018-09-17 DIAGNOSIS — C6290 Malignant neoplasm of unspecified testis, unspecified whether descended or undescended: Secondary | ICD-10-CM

## 2018-09-17 LAB — CBC WITH DIFFERENTIAL (CANCER CENTER ONLY)
Abs Immature Granulocytes: 0.02 10*3/uL (ref 0.00–0.07)
BASOS ABS: 0 10*3/uL (ref 0.0–0.1)
Basophils Relative: 1 %
EOS ABS: 0.1 10*3/uL (ref 0.0–0.5)
EOS PCT: 2 %
HEMATOCRIT: 47.9 % (ref 39.0–52.0)
HEMOGLOBIN: 15.9 g/dL (ref 13.0–17.0)
Immature Granulocytes: 0 %
LYMPHS ABS: 1.7 10*3/uL (ref 0.7–4.0)
LYMPHS PCT: 26 %
MCH: 29.2 pg (ref 26.0–34.0)
MCHC: 33.2 g/dL (ref 30.0–36.0)
MCV: 88.1 fL (ref 80.0–100.0)
MONOS PCT: 10 %
Monocytes Absolute: 0.6 10*3/uL (ref 0.1–1.0)
NRBC: 0 % (ref 0.0–0.2)
Neutro Abs: 3.9 10*3/uL (ref 1.7–7.7)
Neutrophils Relative %: 61 %
Platelet Count: 248 10*3/uL (ref 150–400)
RBC: 5.44 MIL/uL (ref 4.22–5.81)
RDW: 11.9 % (ref 11.5–15.5)
WBC Count: 6.4 10*3/uL (ref 4.0–10.5)

## 2018-09-17 LAB — CMP (CANCER CENTER ONLY)
ALK PHOS: 82 U/L (ref 38–126)
ALT: 99 U/L — AB (ref 0–44)
AST: 46 U/L — ABNORMAL HIGH (ref 15–41)
Albumin: 4.4 g/dL (ref 3.5–5.0)
Anion gap: 8 (ref 5–15)
BUN: 20 mg/dL (ref 6–20)
CALCIUM: 9.7 mg/dL (ref 8.9–10.3)
CHLORIDE: 107 mmol/L (ref 98–111)
CO2: 27 mmol/L (ref 22–32)
Creatinine: 1.09 mg/dL (ref 0.61–1.24)
GFR, Est AFR Am: >60 mL/min (ref >60)
Glucose, Bld: 93 mg/dL (ref 70–99)
Potassium: 4 mmol/L (ref 3.5–5.1)
Sodium: 142 mmol/L (ref 135–145)
Total Bilirubin: 1.5 mg/dL — ABNORMAL HIGH (ref 0.3–1.2)
Total Protein: 7.5 g/dL (ref 6.5–8.1)

## 2018-09-17 MED ORDER — HEPARIN SOD (PORK) LOCK FLUSH 100 UNIT/ML IV SOLN
500.0000 [IU] | Freq: Once | INTRAVENOUS | Status: AC
  Administered 2018-09-17: 500 [IU]
  Filled 2018-09-17: qty 5

## 2018-09-17 MED ORDER — SODIUM CHLORIDE 0.9% FLUSH
10.0000 mL | Freq: Once | INTRAVENOUS | Status: AC
  Administered 2018-09-17: 10 mL
  Filled 2018-09-17: qty 10

## 2018-09-17 NOTE — Patient Instructions (Signed)
Implanted Port Home Guide An implanted port is a type of central line that is placed under the skin. Central lines are used to provide IV access when treatment or nutrition needs to be given through a person's veins. Implanted ports are used for long-term IV access. An implanted port may be placed because:  You need IV medicine that would be irritating to the small veins in your hands or arms.  You need long-term IV medicines, such as antibiotics.  You need IV nutrition for a long period.  You need frequent blood draws for lab tests.  You need dialysis.  Implanted ports are usually placed in the chest area, but they can also be placed in the upper arm, the abdomen, or the leg. An implanted port has two main parts:  Reservoir. The reservoir is round and will appear as a small, raised area under your skin. The reservoir is the part where a needle is inserted to give medicines or draw blood.  Catheter. The catheter is a thin, flexible tube that extends from the reservoir. The catheter is placed into a large vein. Medicine that is inserted into the reservoir goes into the catheter and then into the vein.  How will I care for my incision site? Do not get the incision site wet. Bathe or shower as directed by your health care provider. How is my port accessed? Special steps must be taken to access the port:  Before the port is accessed, a numbing cream can be placed on the skin. This helps numb the skin over the port site.  Your health care provider uses a sterile technique to access the port. ? Your health care provider must put on a mask and sterile gloves. ? The skin over your port is cleaned carefully with an antiseptic and allowed to dry. ? The port is gently pinched between sterile gloves, and a needle is inserted into the port.  Only "non-coring" port needles should be used to access the port. Once the port is accessed, a blood return should be checked. This helps ensure that the port  is in the vein and is not clogged.  If your port needs to remain accessed for a constant infusion, a clear (transparent) bandage will be placed over the needle site. The bandage and needle will need to be changed every week, or as directed by your health care provider.  Keep the bandage covering the needle clean and dry. Do not get it wet. Follow your health care provider's instructions on how to take a shower or bath while the port is accessed.  If your port does not need to stay accessed, no bandage is needed over the port.  What is flushing? Flushing helps keep the port from getting clogged. Follow your health care provider's instructions on how and when to flush the port. Ports are usually flushed with saline solution or a medicine called heparin. The need for flushing will depend on how the port is used.  If the port is used for intermittent medicines or blood draws, the port will need to be flushed: ? After medicines have been given. ? After blood has been drawn. ? As part of routine maintenance.  If a constant infusion is running, the port may not need to be flushed.  How long will my port stay implanted? The port can stay in for as long as your health care provider thinks it is needed. When it is time for the port to come out, surgery will be   done to remove it. The procedure is similar to the one performed when the port was put in. When should I seek immediate medical care? When you have an implanted port, you should seek immediate medical care if:  You notice a bad smell coming from the incision site.  You have swelling, redness, or drainage at the incision site.  You have more swelling or pain at the port site or the surrounding area.  You have a fever that is not controlled with medicine.  This information is not intended to replace advice given to you by your health care provider. Make sure you discuss any questions you have with your health care provider. Document  Released: 11/20/2005 Document Revised: 04/27/2016 Document Reviewed: 07/28/2013 Elsevier Interactive Patient Education  2017 [employer]  

## 2018-09-17 NOTE — Telephone Encounter (Addendum)
Greg Peterson's father will bring him in within the hour for registration.  12:15 pm Lab/ 12:30 pm Flush/ 1:00 pm S.M.C visit.  Father asked if labs done today cancel 09-24-2018 lab orders.

## 2018-09-17 NOTE — Telephone Encounter (Signed)
Symptom management clinic evaluation today or tomorrow.

## 2018-09-19 NOTE — Progress Notes (Signed)
Return to work certification has been successfully faxed to [place] at [phone]. Taken copy to Ms. Wilma at front desk for patient's father to pick up per request.

## 2018-09-19 NOTE — Progress Notes (Signed)
[facility]pt[facility] Progress Note   Greg Peterson [id number] [birth year] 22 y.o.  [name] Pongratz is managed by Dr. [name]  Actively treated with chemotherapy/immunotherapy: no   Assessment: Plan:    Port-A-Cath in place - Plan: heparin lock flush 100 unit/mL, sodium chloride flush (NS) 0.9 % injection 10 mL  Malignant neoplasm of testis, unspecified laterality, unspecified whether descended or undescended ([place]) - Plan: heparin lock flush 100 unit/mL, sodium chloride flush (NS) 0.9 % injection 10 mL  Paresthesia   Malignant neoplasm of testes: The patient is status post 3 cycles of BEP chemotherapy.  He was last seen by Dr. [name] on 07/23/2018.  He is scheduled for a PET scan on 09/24/2018 and will return to see Dr. [name] in follow-up on 10/03/2018.  Paresthesia: The patient was begun on gabapentin 300 mg p.o. nightly by his primary care provider.  I have recommended to him that he continue at this dose for now given that he is only started this medication within the last 10 days to 2 weeks.  Please see After Visit Summary for patient specific instructions.  Future Appointments  Date Time Provider [place]  09/24/2018 12:15 PM [facility] LAB 5 [facility] None  09/24/2018 12:30 PM [facility] [place] FLUSH [facility] None  09/24/2018  1:00 PM [facility] PET CT 1 [facility] [place]  10/03/2018  3:30 PM Shadad, Mathis Dad, MD [facility] None    No orders of the defined types were placed in this encounter.      Subjective:   Patient ID:  Greg Peterson is a 22 y.o. (DOB 11-01-1996) male.  Chief Complaint:  Chief Complaint  Patient presents with  . Peripheral Neuropathy    HPI Greg Peterson is a 22 year old male with a history of a stage IIC seminoma testicular cancer who is followed by Dr. [name].  He was originally diagnosed in January 2019.  He is status post 3 cycles of BEP chemotherapy which she completed on 07/05/2018.  He presents to the clinic today with his  father with a report that he is having tingling in his fingers and toes.  He has recently been started on gabapentin 300 mg p.o. nightly by his primary care provider.  This was started within the last 10 days to 2 weeks.  He is otherwise doing well with no issues of concern.  He has a good appetite.  His weight is recovered since completing chemotherapy.  Medications: I have reviewed the patient's current medications.  Allergies: No Known Allergies  Past Medical History:  Diagnosis Date  . Autism spectrum disorder   . Cancer [facility])    testicular    Past Surgical History:  Procedure Laterality Date  . IR FLUORO GUIDE PORT INSERTION RIGHT  05/07/2018  . IR US GUIDE VASC ACCESS RIGHT  05/07/2018  . ORCHIECTOMY Right 01/04/2018   Procedure: RIGHT RADICAL ORCHIECTOMY;  Surgeon: Irine Seal, MD;  Location: [facility];  Service: Urology;  Laterality: Right;    No family history on file.  Social History   Socioeconomic History  . Marital status: Single    Spouse name: Not on file  . Number of children: Not on file  . Years of education: Not on file  . Highest education level: Not on file  Occupational History  . Not on file  Social Needs  . Financial resource strain: Not on file  . Food insecurity:    Worry: Not on file    Inability: Not on file  .  Transportation needs:    Medical: Not on file    Non-medical: Not on file  Tobacco Use  . Smoking status: Never Smoker  . Smokeless tobacco: Never Used  Substance and Sexual Activity  . Alcohol use: No    Frequency: Never  . Drug use: No  . Sexual activity: Not on file  Lifestyle  . Physical activity:    Days per week: Not on file    Minutes per session: Not on file  . Stress: Not on file  Relationships  . Social connections:    Talks on phone: Not on file    Gets together: Not on file    Attends religious service: Not on file    Active member of club or organization: Not on file    Attends meetings of clubs or  organizations: Not on file    Relationship status: Not on file  . Intimate partner violence:    Fear of current or ex partner: Not on file    Emotionally abused: Not on file    Physically abused: Not on file    Forced sexual activity: Not on file  Other Topics Concern  . Not on file  Social History Narrative  . Not on file    Past Medical History, Surgical history, Social history, and Family history were reviewed and updated as appropriate.   Please see review of systems for further details on the patient's review from today.   Review of Systems:  Review of Systems  Constitutional: Negative for chills, diaphoresis and fever.  HENT: Negative for trouble swallowing and voice change.   Respiratory: Negative for cough, chest tightness, shortness of breath and wheezing.   Cardiovascular: Negative for chest pain, palpitations and leg swelling.  Gastrointestinal: Negative for abdominal pain, constipation, diarrhea, nausea and vomiting.  Genitourinary: Negative for difficulty urinating, dysuria, flank pain, frequency, hematuria and urgency.  Musculoskeletal: Negative for back pain and myalgias.  Skin: Negative for rash.  Neurological: Positive for numbness. Negative for dizziness, light-headedness and headaches.    Objective:   Physical Exam:  BP 127/89 (BP Location: Right Arm, Patient Position: Sitting)   Pulse 84   Temp 97.9 F (36.6 C) (Oral)   Resp 18   Ht 5\' 11"  (1.803 m)   Wt 198 lb 11.2 oz (90.1 kg)   SpO2 100%   BMI 27.71 kg/m  ECOG: 0  Physical Exam  Constitutional: No distress.  HENT:  Head: Normocephalic and atraumatic.  Eyes: Conjunctivae are normal. Right eye exhibits no discharge. Left eye exhibits no discharge. No scleral icterus.  Neck: Normal range of motion. Neck supple.  Cardiovascular: Normal rate, regular rhythm and normal heart sounds. Exam reveals no gallop and no friction rub.  No murmur heard. And accessed chest port is noted in the right chest  wall.  There is no erythema, warmth, swelling, or exudate.  Pulmonary/Chest: Effort normal and breath sounds normal. No respiratory distress. He has no wheezes. He has no rales.  Lymphadenopathy:    He has no cervical adenopathy.  Neurological: He is alert. Coordination normal.  Skin: Skin is warm and dry. No rash noted. He is not diaphoretic. No erythema.  Psychiatric: He has a normal mood and affect. His behavior is normal. Judgment and thought content normal.    Lab Review:     Component Value Date/Time   NA 142 09/17/2018 1227   K 4.0 09/17/2018 1227   CL 107 09/17/2018 1227   CO2 27 09/17/2018 1227   GLUCOSE  93 09/17/2018 1227   BUN 20 09/17/2018 1227   CREATININE 1.09 09/17/2018 1227   CALCIUM 9.7 09/17/2018 1227   PROT 7.5 09/17/2018 1227   ALBUMIN 4.4 09/17/2018 1227   AST 46 (H) 09/17/2018 1227   ALT 99 (H) 09/17/2018 1227   ALKPHOS 82 09/17/2018 1227   BILITOT 1.5 (H) 09/17/2018 1227   GFRNONAA >60 09/17/2018 1227   GFRAA >60 09/17/2018 1227       Component Value Date/Time   WBC 6.4 09/17/2018 1227   WBC 11.6 (H) 06/18/2018 0002   RBC 5.44 09/17/2018 1227   HGB 15.9 09/17/2018 1227   HCT 47.9 09/17/2018 1227   PLT 248 09/17/2018 1227   MCV 88.1 09/17/2018 1227   MCH 29.2 09/17/2018 1227   MCHC 33.2 09/17/2018 1227   RDW 11.9 09/17/2018 1227   LYMPHSABS 1.7 09/17/2018 1227   MONOABS 0.6 09/17/2018 1227   EOSABS 0.1 09/17/2018 1227   BASOSABS 0.0 09/17/2018 1227   -------------------------------  Imaging from last 24 hours (if applicable):  Radiology interpretation: No results found.      This case was discussed Dr. [name]. He expressed agreement with my management of this patient.

## 2018-09-20 ENCOUNTER — Telehealth: Payer: Self-pay | Admitting: *Deleted

## 2018-09-20 DIAGNOSIS — Z95828 Presence of other vascular implants and grafts: Secondary | ICD-10-CM

## 2018-09-20 DIAGNOSIS — C6290 Malignant neoplasm of unspecified testis, unspecified whether descended or undescended: Secondary | ICD-10-CM

## 2018-09-20 MED ORDER — LIDOCAINE-PRILOCAINE 2.5-2.5 % EX CREA: 1.0000 "application " | TOPICAL_CREAM | CUTANEOUS | 1 refills | Status: DC | PRN

## 2018-09-20 NOTE — Telephone Encounter (Signed)
"  Greg Peterson's dad Herbie Baltimore called requesting "refill on Emla Cream to numb port-a-cath be sent to [facility] in [place] with return call to [phone]"  Returned call to inform of refill order sent.

## 2018-09-24 ENCOUNTER — Ambulatory Visit (HOSPITAL_COMMUNITY)
Admission: RE | Admit: 2018-09-24 | Discharge: 2018-09-24 | Disposition: A | Discharge status: Home / Self Care | Payer: BLUE CROSS/BLUE SHIELD | Source: Ambulatory Visit | Attending: Oncology | Admitting: Oncology

## 2018-09-24 ENCOUNTER — Inpatient Hospital Stay: Payer: BLUE CROSS/BLUE SHIELD

## 2018-09-24 DIAGNOSIS — C6290 Malignant neoplasm of unspecified testis, unspecified whether descended or undescended: Secondary | ICD-10-CM

## 2018-09-24 DIAGNOSIS — Z95828 Presence of other vascular implants and grafts: Secondary | ICD-10-CM

## 2018-09-24 DIAGNOSIS — R590 Localized enlarged lymph nodes: Secondary | ICD-10-CM | POA: Insufficient documentation

## 2018-09-24 DIAGNOSIS — C6291 Malignant neoplasm of right testis, unspecified whether descended or undescended: Secondary | ICD-10-CM | POA: Diagnosis not present

## 2018-09-24 LAB — CBC WITH DIFFERENTIAL (CANCER CENTER ONLY)
Abs Immature Granulocytes: 0.02 10*3/uL (ref 0.00–0.07)
BASOS ABS: 0.1 10*3/uL (ref 0.0–0.1)
Basophils Relative: 1 %
EOS ABS: 0.2 10*3/uL (ref 0.0–0.5)
EOS PCT: 2 %
HEMATOCRIT: 46 % (ref 39.0–52.0)
Hemoglobin: 15.3 g/dL (ref 13.0–17.0)
Immature Granulocytes: 0 %
LYMPHS ABS: 2 10*3/uL (ref 0.7–4.0)
Lymphocytes Relative: 27 %
MCH: 29.1 pg (ref 26.0–34.0)
MCHC: 33.3 g/dL (ref 30.0–36.0)
MCV: 87.6 fL (ref 80.0–100.0)
MONO ABS: 0.6 10*3/uL (ref 0.1–1.0)
MONOS PCT: 8 %
NEUTROS PCT: 62 %
NRBC: 0 % (ref 0.0–0.2)
Neutro Abs: 4.6 10*3/uL (ref 1.7–7.7)
Platelet Count: 273 10*3/uL (ref 150–400)
RBC: 5.25 MIL/uL (ref 4.22–5.81)
RDW: 11.9 % (ref 11.5–15.5)
WBC: 7.5 10*3/uL (ref 4.0–10.5)

## 2018-09-24 LAB — CMP (CANCER CENTER ONLY)
ALK PHOS: 90 U/L (ref 38–126)
ALT: 62 U/L — ABNORMAL HIGH (ref 0–44)
ANION GAP: 11 (ref 5–15)
AST: 36 U/L (ref 15–41)
Albumin: 4.4 g/dL (ref 3.5–5.0)
BUN: 19 mg/dL (ref 6–20)
CALCIUM: 9.8 mg/dL (ref 8.9–10.3)
CO2: 24 mmol/L (ref 22–32)
Chloride: 108 mmol/L (ref 98–111)
Creatinine: 1.19 mg/dL (ref 0.61–1.24)
Glucose, Bld: 86 mg/dL (ref 70–99)
Potassium: 4.2 mmol/L (ref 3.5–5.1)
Sodium: 143 mmol/L (ref 135–145)
Total Bilirubin: 1.4 mg/dL — ABNORMAL HIGH (ref 0.3–1.2)
Total Protein: 7.6 g/dL (ref 6.5–8.1)

## 2018-09-24 LAB — LACTATE DEHYDROGENASE: LDH: 204 U/L — AB (ref 98–192)

## 2018-09-24 LAB — GLUCOSE, CAPILLARY: Glucose-Capillary: 77 mg/dL (ref 70–99)

## 2018-09-24 MED ORDER — FLUDEOXYGLUCOSE F - 18 (FDG) INJECTION
11.1100 | Freq: Once | INTRAVENOUS | Status: AC | PRN
  Administered 2018-09-24: 11.11 via INTRAVENOUS

## 2018-09-24 MED ORDER — SODIUM CHLORIDE 0.9% FLUSH
10.0000 mL | Freq: Once | INTRAVENOUS | Status: AC
  Administered 2018-09-24: 10 mL
  Filled 2018-09-24: qty 10

## 2018-09-25 LAB — BETA HCG QUANT (REF LAB)

## 2018-09-25 LAB — AFP TUMOR MARKER: AFP, Serum, Tumor Marker: 1.9 ng/mL (ref 0.0–8.3)

## 2018-10-03 ENCOUNTER — Inpatient Hospital Stay (HOSPITAL_BASED_OUTPATIENT_CLINIC_OR_DEPARTMENT_OTHER): Payer: BLUE CROSS/BLUE SHIELD | Admitting: Oncology

## 2018-10-03 ENCOUNTER — Telehealth: Payer: Self-pay

## 2018-10-03 VITALS — BP 115/86 | HR 107 | Temp 98.4°F | Resp 18 | Ht 71.0 in | Wt 197.0 lb

## 2018-10-03 DIAGNOSIS — C6291 Malignant neoplasm of right testis, unspecified whether descended or undescended: Secondary | ICD-10-CM

## 2018-10-03 DIAGNOSIS — G629 Polyneuropathy, unspecified: Secondary | ICD-10-CM | POA: Diagnosis not present

## 2018-10-03 DIAGNOSIS — C6290 Malignant neoplasm of unspecified testis, unspecified whether descended or undescended: Secondary | ICD-10-CM

## 2018-10-03 NOTE — Telephone Encounter (Signed)
Printed avs and calender of upcoming appointment. Per 10/31 los 

## 2018-10-03 NOTE — Progress Notes (Signed)
Hematology and Oncology Follow Up Visit  Greg Peterson [id number] 1996/05/25 22 y.o. 10/03/2018 3:15 PM Greg Peterson, Greg Peterson, MDSasser, Greg Moment, MD   Principle Diagnosis: 22 year old with man with right testicular cancer diagnosed in January 2019.  He was found to have stage IIC seminoma with pelvic adenopathy as well as elevated beta-hCG 121.  Prior Therapy:  He is status post orchiectomy completed on January 04, 2018.  The final pathology showed pure seminoma with lymphovascular invasion.  He developed recurrent disease with 3.2 cm periaortic lymphadenopathy and elevated hCG of 121 and LDH of 367 in May 2019.  Current therapy: BEP chemotherapy started on 05/20/2018.  He has completed 3 cycles of therapy on July 05, 2018.  Interim History: Greg Peterson is here for a repeat evaluation.  Since the last visit, he reports no major changes or complaints.  He does report peripheral neuropathy mostly in his toes rather than his fingers.  He is able to ambulate without any difficulties and attends activities of daily living.  He reports his neuropathy in his fingers have resolved at this time.  He does use Neurontin which have helped these symptoms.  He is eating better and have gained weight.  He denies any abdominal distention or early satiety.  He does not report any headaches, blurry vision, syncope or seizures.  He denies any alteration in mental status or lethargy.  He does not report any fevers, chills or sweats.  Does not report any cough, wheezing or hemoptysis.  Does not report any chest pain, palpitation, orthopnea or leg edema. .  Does not report any nausea, vomiting or abdominal pain.  Does not report any constipation or diarrhea.  Does not report any bone pain or pathological fractures.  Does not report frequency, urgency or hematuria.  Does not report any skin rashes or lesions.  He denies any ecchymosis or petechiae.  Remaining review of systems is negative.    Medications: I have reviewed the  patient's current medications.  Current Outpatient Medications  Medication Sig Dispense Refill  . citalopram (CELEXA) 10 MG tablet Take 10 mg by mouth daily.    Marland Kitchen gabapentin (NEURONTIN) 300 MG capsule Take 300 mg by mouth at bedtime. Take one at bedtime    . HYDROcodone-acetaminophen (NORCO) 5-325 MG tablet Take 1 tablet by mouth every 6 (six) hours as needed for moderate pain. 6 tablet 0  . hydrocortisone (ANUSOL-HC) 2.5 % rectal cream Place 1 application rectally 2 (two) times daily. 30 g 2  . ibuprofen (ADVIL,MOTRIN) 200 MG tablet Take 200 mg by mouth every 6 (six) hours as needed for moderate pain.     Marland Kitchen lidocaine-prilocaine (EMLA) cream Apply 1 application topically as needed. Apply to port area 1-2 hours before coming to [facility] for treatment 30 g 1  . prochlorperazine (COMPAZINE) 10 MG tablet Take 1 tablet (10 mg total) by mouth every 6 (six) hours as needed for nausea or vomiting. 30 tablet 0  . triamcinolone cream (KENALOG) 0.1 % Apply 1 application topically 2 (two) times daily. Apply to rash twice daily until rash resolved. 45 g 1   No current facility-administered medications for this visit.      Allergies: No Known Allergies  Past Medical History, Surgical history, Social history, and Family History remained without any change in updated today.    Physical Exam:  Blood pressure 115/86, pulse (!) 107, temperature 98.4 F (36.9 C), temperature source Oral, resp. rate 18, height 5\' 11"  (1.803 m), weight 197 lb (  89.4 kg), SpO2 98 %.  ECOG: 0 General appearance: Comfortable appearing without any discomfort Head: Normocephalic without any trauma Oropharynx: Mucous membranes are moist and pink without any thrush or ulcers. Eyes: Pupils are equal and round reactive to light. Lymph nodes: No cervical, supraclavicular, inguinal or axillary lymphadenopathy.   Heart:regular rate and rhythm.  S1 and S2 without leg edema. Lung: Clear without any rhonchi or wheezes.  No dullness  to percussion. Abdomin: Soft, nontender, nondistended with good bowel sounds.  No hepatosplenomegaly. Musculoskeletal: No joint deformity or effusion.  Full range of motion noted. Neurological: No deficits noted on motor, sensory and deep tendon reflex exam. Skin: No petechial rash or dryness.  Port-A-Cath site appeared without any erythema or induration. .     Lab Results: Lab Results  Component Value Date   WBC 7.5 09/24/2018   HGB 15.3 09/24/2018   HCT 46.0 09/24/2018   MCV 87.6 09/24/2018   PLT 273 09/24/2018     Chemistry      Component Value Date/Time   NA 143 09/24/2018 1219   K 4.2 09/24/2018 1219   CL 108 09/24/2018 1219   CO2 24 09/24/2018 1219   BUN 19 09/24/2018 1219   CREATININE 1.19 09/24/2018 1219      Component Value Date/Time   CALCIUM 9.8 09/24/2018 1219   ALKPHOS 90 09/24/2018 1219   AST 36 09/24/2018 1219   ALT 62 (H) 09/24/2018 1219   BILITOT 1.4 (H) 09/24/2018 1219     Results for Greg Peterson, Greg Peterson (MRN [id number]) as of 10/03/2018 14:23  Ref. Range 09/24/2018 12:19  AFP, Serum, Tumor Marker Latest Ref Range: 0.0 - 8.3 ng/mL 1.9  hCG Quant Latest Ref Range: 0 - 3 mIU/mL <1   Results for Greg Peterson, Greg Peterson (MRN [id number]) as of 10/03/2018 14:23  Ref. Range 07/17/2018 11:29 09/24/2018 12:19  LDH Latest Ref Range: 98 - 192 U/L 195 (H) 204 (H)   EXAM: NUCLEAR MEDICINE PET SKULL BASE TO THIGH  TECHNIQUE: 11.1 mCi F-18 FDG was injected intravenously. Full-ring PET imaging was performed from the skull base to thigh after the radiotracer. CT data was obtained and used for attenuation correction and anatomic localization.  Fasting blood glucose: 77 mg/dl  COMPARISON:  CT chest abdomen and pelvis 07/15/2018  FINDINGS: Mediastinal blood pool activity: SUV max 2.5  NECK: There is intense bilateral and symmetric uptake localizing to the tonsils. No mass identified. Subcentimeter bilateral level 2 lymph nodes are identified and exhibits increased  uptake with SUV max of 5.35.  Incidental CT findings: none  CHEST: No hypermetabolic axillary, supraclavicular, mediastinal or hilar lymph nodes.  No pleural effusion identified.  No hypermetabolic pulmonary nodule.  Incidental CT findings: none  ABDOMEN/PELVIS: No abnormal uptake within the liver, pancreas, spleen, or adrenal glands. Aortocaval lymph node measures 1.3 cm on today's exam and has an SUV max of 2.26, image 142/4. On CT from 07/15/2018 this measured 1.8 cm. That looks no additional enlarged retroperitoneal lymph nodes. No pelvic or inguinal adenopathy identified.  Incidental CT findings: None  SKELETON: No focal hypermetabolic activity to suggest skeletal metastasis.  Incidental CT findings: none  IMPRESSION: 1. Continued improvement in retroperitoneal adenopathy. Single persist enlarged lymph node measures 1.3 cm in exhibits mild FDG uptake, SUV max 2.26. On 07/15/2018 this node measured 1.8 cm. No new or progressive adenopathy identified. 2. Intense radiotracer uptake within both tonsillar regions with associated mild to moderate hypermetabolic subcentimeter bilateral level 2 lymph nodes. Likely reactive secondary to inflammatory/infectious process.  Impression and Plan:   22 year old man with:  1.  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  He is status post orchiectomy followed by BEP chemotherapy with near resolution of his disease.  His PET scan obtained on September 24, 2018 was reviewed personally and discussed with the patient today.  The scan showed slightly enlarged single lymph node measuring 1.3 cm which exhibits low level FDG uptake which is consistent with disease response at this time.  I do not think these findings suggest a residual viable tumor in his pelvic adenopathy that requires surgical resection.  Based on these findings, I have recommended continued active surveillance.  We will obtain repeat tumor  marker in 3 months and repeat abdominal imaging in 6 months.     2.  IV access: Port-A-Cath remains in place and has been flushed periodically.  Risks and benefits removing it was discussed today the plan is to keep it for at least the next 6 months and will remove it if he continues to be disease-free.   3.  Pulmonary function assessment: Delayed toxicities related to bleomycin noted.   4.  Follow-up: We will be in 3 months.  15  minutes was spent with the patient face-to-face today.  More than 50% of time was dedicated to discussing his disease status, treatment options as well as reviewing imaging studies.    Zola Button, MD 10/31/20193:15 PM

## 2018-11-14 ENCOUNTER — Inpatient Hospital Stay: Payer: BLUE CROSS/BLUE SHIELD | Attending: Oncology

## 2018-11-14 DIAGNOSIS — Z95828 Presence of other vascular implants and grafts: Secondary | ICD-10-CM

## 2018-11-14 DIAGNOSIS — C6290 Malignant neoplasm of unspecified testis, unspecified whether descended or undescended: Secondary | ICD-10-CM

## 2018-11-14 DIAGNOSIS — C6291 Malignant neoplasm of right testis, unspecified whether descended or undescended: Secondary | ICD-10-CM | POA: Insufficient documentation

## 2018-11-14 MED ORDER — HEPARIN SOD (PORK) LOCK FLUSH 100 UNIT/ML IV SOLN
500.0000 [IU] | Freq: Once | INTRAVENOUS | Status: AC
  Administered 2018-11-14: 500 [IU]
  Filled 2018-11-14: qty 5

## 2018-11-14 MED ORDER — SODIUM CHLORIDE 0.9% FLUSH
10.0000 mL | Freq: Once | INTRAVENOUS | Status: AC
  Administered 2018-11-14: 10 mL
  Filled 2018-11-14: qty 10

## 2019-01-03 ENCOUNTER — Other Ambulatory Visit: Payer: BLUE CROSS/BLUE SHIELD

## 2019-01-03 ENCOUNTER — Ambulatory Visit: Payer: BLUE CROSS/BLUE SHIELD | Admitting: Hematology and Oncology

## 2019-01-07 ENCOUNTER — Telehealth: Payer: Self-pay | Admitting: Oncology

## 2019-01-07 ENCOUNTER — Inpatient Hospital Stay: Payer: BLUE CROSS/BLUE SHIELD

## 2019-01-07 ENCOUNTER — Inpatient Hospital Stay: Payer: BLUE CROSS/BLUE SHIELD | Attending: Oncology

## 2019-01-07 ENCOUNTER — Inpatient Hospital Stay (HOSPITAL_BASED_OUTPATIENT_CLINIC_OR_DEPARTMENT_OTHER): Payer: BLUE CROSS/BLUE SHIELD | Admitting: Oncology

## 2019-01-07 VITALS — BP 113/77 | HR 64 | Temp 98.1°F | Resp 17 | Ht 71.0 in | Wt 195.7 lb

## 2019-01-07 DIAGNOSIS — R590 Localized enlarged lymph nodes: Secondary | ICD-10-CM | POA: Diagnosis not present

## 2019-01-07 DIAGNOSIS — Z9221 Personal history of antineoplastic chemotherapy: Secondary | ICD-10-CM | POA: Insufficient documentation

## 2019-01-07 DIAGNOSIS — Z9079 Acquired absence of other genital organ(s): Secondary | ICD-10-CM | POA: Diagnosis not present

## 2019-01-07 DIAGNOSIS — R42 Dizziness and giddiness: Secondary | ICD-10-CM | POA: Insufficient documentation

## 2019-01-07 DIAGNOSIS — Z8547 Personal history of malignant neoplasm of testis: Secondary | ICD-10-CM | POA: Insufficient documentation

## 2019-01-07 DIAGNOSIS — Z95828 Presence of other vascular implants and grafts: Secondary | ICD-10-CM

## 2019-01-07 DIAGNOSIS — Z79899 Other long term (current) drug therapy: Secondary | ICD-10-CM

## 2019-01-07 DIAGNOSIS — C6290 Malignant neoplasm of unspecified testis, unspecified whether descended or undescended: Secondary | ICD-10-CM

## 2019-01-07 LAB — CBC WITH DIFFERENTIAL (CANCER CENTER ONLY)
Abs Immature Granulocytes: 0.01 10*3/uL (ref 0.00–0.07)
BASOS PCT: 1 %
Basophils Absolute: 0 10*3/uL (ref 0.0–0.1)
EOS ABS: 0.1 10*3/uL (ref 0.0–0.5)
Eosinophils Relative: 2 %
HCT: 45.9 % (ref 39.0–52.0)
Hemoglobin: 15.3 g/dL (ref 13.0–17.0)
Immature Granulocytes: 0 %
Lymphocytes Relative: 29 %
Lymphs Abs: 1.5 10*3/uL (ref 0.7–4.0)
MCH: 29 pg (ref 26.0–34.0)
MCHC: 33.3 g/dL (ref 30.0–36.0)
MCV: 86.9 fL (ref 80.0–100.0)
Monocytes Absolute: 0.5 10*3/uL (ref 0.1–1.0)
Monocytes Relative: 9 %
Neutro Abs: 3.2 10*3/uL (ref 1.7–7.7)
Neutrophils Relative %: 59 %
PLATELETS: 248 10*3/uL (ref 150–400)
RBC: 5.28 MIL/uL (ref 4.22–5.81)
RDW: 12.6 % (ref 11.5–15.5)
WBC Count: 5.4 10*3/uL (ref 4.0–10.5)
nRBC: 0 % (ref 0.0–0.2)

## 2019-01-07 LAB — CMP (CANCER CENTER ONLY)
ALT: 32 U/L (ref 0–44)
ANION GAP: 8 (ref 5–15)
AST: 21 U/L (ref 15–41)
Albumin: 4.4 g/dL (ref 3.5–5.0)
Alkaline Phosphatase: 83 U/L (ref 38–126)
BILIRUBIN TOTAL: 2.2 mg/dL — AB (ref 0.3–1.2)
BUN: 14 mg/dL (ref 6–20)
CALCIUM: 9.5 mg/dL (ref 8.9–10.3)
CO2: 26 mmol/L (ref 22–32)
Chloride: 109 mmol/L (ref 98–111)
Creatinine: 1.03 mg/dL (ref 0.61–1.24)
GFR, Est AFR Am: >60 mL/min (ref >60)
GFR, Estimated: >60 mL/min (ref >60)
Glucose, Bld: 92 mg/dL (ref 70–99)
Potassium: 4 mmol/L (ref 3.5–5.1)
Sodium: 143 mmol/L (ref 135–145)
Total Protein: 7.2 g/dL (ref 6.5–8.1)

## 2019-01-07 LAB — LACTATE DEHYDROGENASE: LDH: 130 U/L (ref 98–192)

## 2019-01-07 MED ORDER — SODIUM CHLORIDE 0.9% FLUSH
10.0000 mL | Freq: Once | INTRAVENOUS | Status: AC
  Administered 2019-01-07: 10 mL
  Filled 2019-01-07: qty 10

## 2019-01-07 MED ORDER — HEPARIN SOD (PORK) LOCK FLUSH 100 UNIT/ML IV SOLN
500.0000 [IU] | Freq: Once | INTRAVENOUS | Status: AC
  Administered 2019-01-07: 500 [IU]
  Filled 2019-01-07: qty 5

## 2019-01-07 NOTE — Telephone Encounter (Signed)
Scheduled appt per 02/04 los. ° °Printed calendar and avs. °

## 2019-01-07 NOTE — Progress Notes (Signed)
Hematology and Oncology Follow Up Visit  Greg Peterson [id number] 1996-10-24 23 y.o. 01/07/2019 1:08 PM [name], [name], MDSasser, [name], MD   Principle Diagnosis: 23 year old with man with stage IIC right testicular seminoma diagnosed in January 2019.     Prior Therapy:  He is status post orchiectomy completed on January 04, 2018.  The final pathology showed pure seminoma with lymphovascular invasion.  He developed recurrent disease with 3.2 cm periaortic lymphadenopathy and elevated hCG of 121 and LDH of 367 in May 2019.  BEP chemotherapy started on 05/20/2018.  He has completed 3 cycles of therapy on July 05, 2018.  Current therapy: Active surveillance  Interim History: Greg Peterson is here for a follow-up visit.  Since the last visit, he reports no major changes in his health.  He continues to have vague complaints of occasional headaches and dizziness although he is eating reasonably well but does report periodic nausea and loss of appetite.  His weight remained stable and he continues to attend to activities of daily living.  He denies any abdominal distention or early satiety.  Denies any changes in his bowels.  His performance status quality of life is unchanged.  Patient denied any alteration mental status, neuropathy, or seizures.  Denies any headaches or lethargy.  Denies any night sweats, weight loss or changes in appetite.  Denied orthopnea, dyspnea on exertion or chest discomfort.  Denies shortness of breath, difficulty breathing hemoptysis or cough.  Denies any abdominal distention, nausea, early satiety or dyspepsia.  Denies any hematuria, frequency, dysuria or nocturia.  Denies any skin irritation, dryness or rash.  Denies any ecchymosis or petechiae.  Denies any lymphadenopathy or clotting.  Denies any heat or cold intolerance.  Denies any anxiety or depression.  Remaining review of system is negative.     Medications: I have reviewed the patient's current medications.   Current Outpatient Medications  Medication Sig Dispense Refill  . citalopram (CELEXA) 10 MG tablet Take 10 mg by mouth daily.    Marland Kitchen gabapentin (NEURONTIN) 300 MG capsule Take 300 mg by mouth at bedtime. Take one at bedtime    . HYDROcodone-acetaminophen (NORCO) 5-325 MG tablet Take 1 tablet by mouth every 6 (six) hours as needed for moderate pain. 6 tablet 0  . hydrocortisone (ANUSOL-HC) 2.5 % rectal cream Place 1 application rectally 2 (two) times daily. 30 g 2  . ibuprofen (ADVIL,MOTRIN) 200 MG tablet Take 200 mg by mouth every 6 (six) hours as needed for moderate pain.     Marland Kitchen lidocaine-prilocaine (EMLA) cream Apply 1 application topically as needed. Apply to port area 1-2 hours before coming to [facility] for treatment 30 g 1  . prochlorperazine (COMPAZINE) 10 MG tablet Take 1 tablet (10 mg total) by mouth every 6 (six) hours as needed for nausea or vomiting. 30 tablet 0  . triamcinolone cream (KENALOG) 0.1 % Apply 1 application topically 2 (two) times daily. Apply to rash twice daily until rash resolved. 45 g 1   No current facility-administered medications for this visit.      Allergies: No Known Allergies  Past Medical History, Surgical history, Social history, and Family History remained without any change in updated today.    Physical Exam: Blood pressure 113/77, pulse 64, temperature 98.1 F (36.7 C), temperature source Oral, resp. rate 17, height 5\' 11"  (1.803 m), weight 195 lb 11.2 oz (88.8 kg), SpO2 100 %.    ECOG: 0   General appearance: Alert, awake without any distress. Head:  Atraumatic without abnormalities Oropharynx: Without any thrush or ulcers. Eyes: No scleral icterus.  Pupils are equal and round reactive to light. Lymph nodes: No lymphadenopathy noted in the cervical, supraclavicular, or axillary nodes Heart:regular rate and rhythm, without any murmurs or gallops.   Lung: Clear to auscultation without any rhonchi, wheezes or dullness to  percussion. Abdomin: Soft, nontender without any shifting dullness or ascites. Musculoskeletal: No clubbing or cyanosis. Neurological: No motor or sensory deficits.  Ambulating without any difficulties. Skin: No rashes or lesions.       Lab Results: Lab Results  Component Value Date   WBC 7.5 09/24/2018   HGB 15.3 09/24/2018   HCT 46.0 09/24/2018   MCV 87.6 09/24/2018   PLT 273 09/24/2018     Chemistry      Component Value Date/Time   NA 143 09/24/2018 1219   K 4.2 09/24/2018 1219   CL 108 09/24/2018 1219   CO2 24 09/24/2018 1219   BUN 19 09/24/2018 1219   CREATININE 1.19 09/24/2018 1219      Component Value Date/Time   CALCIUM 9.8 09/24/2018 1219   ALKPHOS 90 09/24/2018 1219   AST 36 09/24/2018 1219   ALT 62 (H) 09/24/2018 1219   BILITOT 1.4 (H) 09/24/2018 1219     Results for [name], [name] (MRN [id number]) as of 10/03/2018 14:23  Ref. Range 09/24/2018 12:19  AFP, Serum, Tumor Marker Latest Ref Range: 0.0 - 8.3 ng/mL 1.9  hCG Quant Latest Ref Range: 0 - 3 mIU/mL <1   Results for [name], [name] (MRN [id number]) as of 10/03/2018 14:23  Ref. Range 07/17/2018 11:29 09/24/2018 12:19  LDH Latest Ref Range: 98 - 192 U/L 195 (H) 204 (H)    IMPRESSION: 1. Continued improvement in retroperitoneal adenopathy. Single persist enlarged lymph node measures 1.3 cm in exhibits mild FDG uptake, SUV max 2.26. On 07/15/2018 this node measured 1.8 cm. No new or progressive adenopathy identified. 2. Intense radiotracer uptake within both tonsillar regions with associated mild to moderate hypermetabolic subcentimeter bilateral level 2 lymph nodes. Likely reactive secondary to inflammatory/infectious process.    Impression and Plan:   23 year old man with:  1.  Seminoma diagnosed in January 2019.  He was found to have stage IIC with pelvic adenopathy.  He is status post orchiectomy followed by chemotherapy without any evidence of residual disease at this time.  The  natural course of this disease as well as future treatment management were reviewed today.  I have recommended continued active surveillance at this time.  PET CT scan in October 2019 was reviewed again and showed very little abnormalities and likely reflects treated seminoma.  Different salvage therapies including systemic chemotherapy was reviewed in case he develops recurrent disease.  RPLND would be also a possibility.  I have recommended repeat imaging studies and 3 months and every 6 months to complete 2 years of therapy.  After this will be done annually.  He is agreeable to continue at this time.     2.  IV access: Port-A-Cath is currently being flushed periodically.  This will be removed if his next CT scan is within normal range.   3.  Age-appropriate cancer screening: He remains up-to-date at this time.  4.  Dizziness: Unclear etiology at this time.  Metastatic malignancy is considered less likely but always a possibility.  Will obtain MRI of the brain to evaluate that.  5.  Follow-up: We will be in 3 months with repeat imaging studies.  15  minutes  was spent with the patient face-to-face today.  More than 50% of time was dedicated to reviewing his disease status, laboratory data, treatment options and answering question regarding future plan of care.    Zola Button, MD 2/4/20201:08 PM

## 2019-01-08 LAB — AFP TUMOR MARKER: AFP, Serum, Tumor Marker: 2.1 ng/mL (ref 0.0–8.3)

## 2019-01-08 LAB — BETA HCG QUANT (REF LAB): hCG Quant: <1 m[IU]/mL (ref 0–3)

## 2019-01-14 ENCOUNTER — Telehealth: Payer: Self-pay | Admitting: *Deleted

## 2019-01-14 ENCOUNTER — Ambulatory Visit (HOSPITAL_COMMUNITY): Payer: BLUE CROSS/BLUE SHIELD

## 2019-01-14 NOTE — Telephone Encounter (Signed)
Medical records faxed to [facility]; RI# [phone]

## 2019-01-18 ENCOUNTER — Ambulatory Visit
Admission: RE | Admit: 2019-01-18 | Discharge: 2019-01-18 | Disposition: A | Discharge status: Home / Self Care | Payer: BLUE CROSS/BLUE SHIELD | Source: Ambulatory Visit | Attending: Oncology | Admitting: Oncology

## 2019-01-18 DIAGNOSIS — C6290 Malignant neoplasm of unspecified testis, unspecified whether descended or undescended: Secondary | ICD-10-CM

## 2019-01-18 MED ORDER — GADOBENATE DIMEGLUMINE 529 MG/ML IV SOLN
18.0000 mL | Freq: Once | INTRAVENOUS | Status: AC | PRN
  Administered 2019-01-18: 18 mL via INTRAVENOUS

## 2019-01-22 ENCOUNTER — Telehealth: Payer: Self-pay

## 2019-01-22 NOTE — Telephone Encounter (Signed)
Contacted patient and provided the MRI results to the patients father. No other questions or concerns.

## 2019-01-22 NOTE — Telephone Encounter (Signed)
-----   Message from Wyatt Portela, MD sent at 01/20/2019  6:00 PM EST ----- Please let him know his his MRI is normal.

## 2019-01-27 ENCOUNTER — Other Ambulatory Visit: Payer: Self-pay | Admitting: Oncology

## 2019-01-27 ENCOUNTER — Telehealth: Payer: Self-pay | Admitting: *Deleted

## 2019-01-27 DIAGNOSIS — C6290 Malignant neoplasm of unspecified testis, unspecified whether descended or undescended: Secondary | ICD-10-CM

## 2019-01-27 NOTE — Telephone Encounter (Signed)
Done

## 2019-01-27 NOTE — Telephone Encounter (Signed)
Patient's father calling. States Greg Peterson's CT was set up at [facility]. Father's insurance will only pay for [name]. Scheduling states dr [name] will need to place a new order for CT at [facility] imaging.

## 2019-03-28 ENCOUNTER — Other Ambulatory Visit: Payer: Self-pay

## 2019-03-28 ENCOUNTER — Ambulatory Visit
Admission: RE | Admit: 2019-03-28 | Discharge: 2019-03-28 | Disposition: A | Discharge status: Home / Self Care | Payer: BLUE CROSS/BLUE SHIELD | Source: Ambulatory Visit | Attending: Oncology | Admitting: Oncology

## 2019-03-28 DIAGNOSIS — C6290 Malignant neoplasm of unspecified testis, unspecified whether descended or undescended: Secondary | ICD-10-CM

## 2019-03-28 MED ORDER — IOPAMIDOL (ISOVUE-300) INJECTION 61%
100.0000 mL | Freq: Once | INTRAVENOUS | Status: AC | PRN
  Administered 2019-03-28: 100 mL via INTRAVENOUS

## 2019-04-01 ENCOUNTER — Inpatient Hospital Stay: Payer: BLUE CROSS/BLUE SHIELD | Attending: Oncology

## 2019-04-01 ENCOUNTER — Inpatient Hospital Stay: Payer: BLUE CROSS/BLUE SHIELD

## 2019-04-01 ENCOUNTER — Ambulatory Visit (HOSPITAL_COMMUNITY): Payer: BLUE CROSS/BLUE SHIELD

## 2019-04-01 ENCOUNTER — Other Ambulatory Visit: Payer: Self-pay

## 2019-04-01 DIAGNOSIS — Z8546 Personal history of malignant neoplasm of prostate: Secondary | ICD-10-CM | POA: Diagnosis present

## 2019-04-01 DIAGNOSIS — C6290 Malignant neoplasm of unspecified testis, unspecified whether descended or undescended: Secondary | ICD-10-CM

## 2019-04-01 DIAGNOSIS — Z95828 Presence of other vascular implants and grafts: Secondary | ICD-10-CM

## 2019-04-01 LAB — CMP (CANCER CENTER ONLY)
ALT: 41 U/L (ref 0–44)
AST: 25 U/L (ref 15–41)
Albumin: 4.3 g/dL (ref 3.5–5.0)
Alkaline Phosphatase: 86 U/L (ref 38–126)
Anion gap: 9 (ref 5–15)
BUN: 23 mg/dL — ABNORMAL HIGH (ref 6–20)
CO2: 26 mmol/L (ref 22–32)
Calcium: 9.3 mg/dL (ref 8.9–10.3)
Chloride: 107 mmol/L (ref 98–111)
Creatinine: 1.22 mg/dL (ref 0.61–1.24)
GFR, Est AFR Am: >60 mL/min (ref >60)
GFR, Estimated: >60 mL/min (ref >60)
Glucose, Bld: 87 mg/dL (ref 70–99)
Potassium: 4.2 mmol/L (ref 3.5–5.1)
Sodium: 142 mmol/L (ref 135–145)
Total Bilirubin: 1 mg/dL (ref 0.3–1.2)
Total Protein: 7.2 g/dL (ref 6.5–8.1)

## 2019-04-01 LAB — LACTATE DEHYDROGENASE: LDH: 180 U/L (ref 98–192)

## 2019-04-01 LAB — CBC WITH DIFFERENTIAL (CANCER CENTER ONLY)
Abs Immature Granulocytes: 0.01 10*3/uL (ref 0.00–0.07)
Basophils Absolute: 0.1 10*3/uL (ref 0.0–0.1)
Basophils Relative: 1 %
Eosinophils Absolute: 0.2 10*3/uL (ref 0.0–0.5)
Eosinophils Relative: 3 %
HCT: 46.5 % (ref 39.0–52.0)
Hemoglobin: 15.8 g/dL (ref 13.0–17.0)
Immature Granulocytes: 0 %
Lymphocytes Relative: 29 %
Lymphs Abs: 2 10*3/uL (ref 0.7–4.0)
MCH: 29.7 pg (ref 26.0–34.0)
MCHC: 34 g/dL (ref 30.0–36.0)
MCV: 87.4 fL (ref 80.0–100.0)
Monocytes Absolute: 0.7 10*3/uL (ref 0.1–1.0)
Monocytes Relative: 10 %
Neutro Abs: 4 10*3/uL (ref 1.7–7.7)
Neutrophils Relative %: 57 %
Platelet Count: 251 10*3/uL (ref 150–400)
RBC: 5.32 MIL/uL (ref 4.22–5.81)
RDW: 12.2 % (ref 11.5–15.5)
WBC Count: 7.1 10*3/uL (ref 4.0–10.5)
nRBC: 0 % (ref 0.0–0.2)

## 2019-04-01 MED ORDER — SODIUM CHLORIDE 0.9% FLUSH
10.0000 mL | Freq: Once | INTRAVENOUS | Status: AC
  Administered 2019-04-01: 12:00:00 10 mL
  Filled 2019-04-01: qty 10

## 2019-04-01 MED ORDER — HEPARIN SOD (PORK) LOCK FLUSH 100 UNIT/ML IV SOLN
500.0000 [IU] | Freq: Once | INTRAVENOUS | Status: AC
  Administered 2019-04-01: 12:00:00 500 [IU]
  Filled 2019-04-01: qty 5

## 2019-04-02 LAB — BETA HCG QUANT (REF LAB): hCG Quant: <1 m[IU]/mL (ref 0–3)

## 2019-04-02 LAB — AFP TUMOR MARKER: AFP, Serum, Tumor Marker: 3.2 ng/mL (ref 0.0–8.3)

## 2019-04-08 ENCOUNTER — Inpatient Hospital Stay: Payer: BLUE CROSS/BLUE SHIELD | Attending: Oncology | Admitting: Oncology

## 2019-04-08 DIAGNOSIS — C6290 Malignant neoplasm of unspecified testis, unspecified whether descended or undescended: Secondary | ICD-10-CM

## 2019-04-08 DIAGNOSIS — Z79899 Other long term (current) drug therapy: Secondary | ICD-10-CM | POA: Diagnosis not present

## 2019-04-08 DIAGNOSIS — Z9221 Personal history of antineoplastic chemotherapy: Secondary | ICD-10-CM | POA: Diagnosis not present

## 2019-04-08 DIAGNOSIS — Z8546 Personal history of malignant neoplasm of prostate: Secondary | ICD-10-CM | POA: Diagnosis not present

## 2019-04-08 NOTE — Progress Notes (Signed)
Hematology and Oncology Follow Up for Telemedicine Visits  Greg Peterson [id number] [birth year] 23 y.o. 04/08/2019 9:37 AM [name], [name], MDSasser, [name], MD   I connected with Greg Peterson on 04/08/19 at 10:30 AM EDT by telephone visit and verified that I am speaking with the correct person using two identifiers.   I discussed the limitations, risks, security and privacy concerns of performing an evaluation and management service by telemedicine and the availability of in-person appointments. I also discussed with the patient that there may be a patient responsible charge related to this service. The patient expressed understanding and agreed to proceed.  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  He was found to have pelvic adenopathy and beta-hCG of 121.   Prior Therapy: He is status post orchiectomy completed on January 04, 2018.  The final pathology showed pure seminoma with lymphovascular invasion.  He developed recurrent disease with 3.2 cm periaortic lymphadenopathy and elevated hCG of 121 and LDH of 367 in May 2019.  BEP chemotherapy started on 05/20/2018.  He has completed 3 cycles of therapy on July 05, 2018 with a complete response.  Current therapy: Active surveillance.  Interim History: Greg Peterson reports no major changes in his health.  Continues to be active and tries to work at times.  He has no issues associated with his Port-A-Cath.  He denies any abdominal pain or discomfort.  He denies any masses or lesions.    Medications: I have reviewed the patient's current medications.  Current Outpatient Medications  Medication Sig Dispense Refill  . citalopram (CELEXA) 10 MG tablet Take 10 mg by mouth daily.    Marland Kitchen gabapentin (NEURONTIN) 300 MG capsule Take 300 mg by mouth at bedtime. Take one at  bedtime    . HYDROcodone-acetaminophen (NORCO) 5-325 MG tablet Take 1 tablet by mouth every 6 (six) hours as needed for moderate pain. 6 tablet 0  . hydrocortisone (ANUSOL-HC) 2.5 % rectal cream Place 1 application rectally 2 (two) times daily. 30 g 2  . ibuprofen (ADVIL,MOTRIN) 200 MG tablet Take 200 mg by mouth every 6 (six) hours as needed for moderate pain.     Marland Kitchen lidocaine-prilocaine (EMLA) cream Apply 1 application topically as needed. Apply to port area 1-2 hours before coming to [facility] for treatment 30 g 1  . prochlorperazine (COMPAZINE) 10 MG tablet Take 1 tablet (10 mg total) by mouth every 6 (six) hours as needed for nausea or vomiting. 30 tablet 0  . triamcinolone cream (KENALOG) 0.1 % Apply 1 application topically 2 (two) times daily. Apply to rash twice daily until rash resolved. 45 g 1   No current facility-administered medications for this visit.      Allergies: No Known Allergies  Past Medical History, Surgical history, Social history, and Family History were reviewed and updated.      Lab Results: Lab Results  Component Value Date   WBC 7.1 04/01/2019   HGB 15.8 04/01/2019   HCT 46.5 04/01/2019   MCV 87.4 04/01/2019   PLT 251 04/01/2019     Chemistry      Component Value Date/Time   NA 142 04/01/2019 1106   K 4.2 04/01/2019 1106   CL 107 04/01/2019 1106   CO2 26 04/01/2019 1106   BUN 23 (H) 04/01/2019 1106   CREATININE 1.22 04/01/2019  1106      Component Value Date/Time   CALCIUM 9.3 04/01/2019 1106   ALKPHOS 86 04/01/2019 1106   AST 25 04/01/2019 1106   ALT 41 04/01/2019 1106   BILITOT 1.0 04/01/2019 1106     Results for Greg Peterson, Greg Peterson (MRN [id number]) as of 04/08/2019 09:42  Ref. Range 04/01/2019 11:06  AFP, Serum, Tumor Marker Latest Ref Range: 0.0 - 8.3 ng/mL 3.2  hCG Quant Latest Ref Range: 0 - 3 mIU/mL <1    Radiological Studies:  EXAM: CT CHEST, ABDOMEN, AND PELVIS WITH CONTRAST  TECHNIQUE: Multidetector CT imaging of the chest,  abdomen and pelvis was performed following the standard protocol during bolus administration of intravenous contrast.  CONTRAST:  185mL ISOVUE-300 IOPAMIDOL (ISOVUE-300) INJECTION 61%  COMPARISON:  PET-CT on 09/24/2018  FINDINGS: CT CHEST FINDINGS  Cardiovascular: No acute findings.  Mediastinum/Lymph Nodes: No masses or pathologically enlarged lymph nodes identified. Thymic hyperplasia in anterior mediastinum shows no significant change.  Lungs/Pleura: No pulmonary infiltrate or mass identified. No effusion present.  Musculoskeletal:  No suspicious bone lesions identified.  CT ABDOMEN AND PELVIS FINDINGS  Hepatobiliary: No masses identified. Gallbladder is unremarkable.  Pancreas:  No mass or inflammatory changes.  Spleen:  Within normal limits in size and appearance.  Adrenals/Urinary tract:  No masses or hydronephrosis.  Stomach/Bowel: No evidence of obstruction, inflammatory process, or abnormal fluid collections. Normal appendix visualized.  Vascular/Lymphatic: Stable 1.3 cm retroperitoneal lymph node in the aortocaval space. Other tiny sub-cm retroperitoneal lymph nodes are stable. No new or enlarging sites of lymphadenopathy identified within the abdomen or pelvis.  Reproductive:  No mass or other significant abnormality identified.  Other:  Previous right orchiectomy again noted.  Musculoskeletal:  No suspicious bone lesions identified.  IMPRESSION: Stable mild abdominal retroperitoneal lymphadenopathy.  No new or progressive metastatic disease identified within the chest, abdomen, or pelvis.   Impression and Plan:  23 year old man with:  1.    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  He was found to have stage IIC  at that time.  He is status post systemic chemotherapy as mentioned above with a complete response.  CT scan obtained on 03/28/2019 was personally reviewed today and discussed with the patient over  the phone.  His CT scan showed no evidence of metastatic disease.  He has borderline enlarged lymph node at 1.3 cm which is unchanged.  At this time I recommended continued active surveillance with consideration for RPLND if his tumor is enlarging.     2.  IV access: Port-A-Cath removal was discussed today.  He is agreeable to have that removed.   3.  Follow-up: We will be in 6 months for repeat imaging studies.   I discussed the assessment and treatment plan with the patient. The patient was provided an opportunity to ask questions and all were answered. The patient agreed with the plan and demonstrated an understanding of the instructions.   The patient was advised to call back or seek an in-person evaluation if the symptoms worsen or if the condition fails to improve as anticipated.  I provided 20 minutes of non face-to-face telephone visit time during this encounter, and > 50% was dedicated to reviewing imaging studies, laboratory data and discussing future plan of care.  Zola Button, MD 04/08/2019 9:37 AM

## 2019-04-15 ENCOUNTER — Other Ambulatory Visit: Payer: Self-pay | Admitting: Radiology

## 2019-04-16 ENCOUNTER — Other Ambulatory Visit: Payer: Self-pay

## 2019-04-16 ENCOUNTER — Ambulatory Visit (HOSPITAL_COMMUNITY)
Admission: RE | Admit: 2019-04-16 | Discharge: 2019-04-16 | Disposition: A | Discharge status: Home / Self Care | Payer: BLUE CROSS/BLUE SHIELD | Source: Ambulatory Visit | Attending: Oncology | Admitting: Oncology

## 2019-04-16 ENCOUNTER — Encounter (HOSPITAL_COMMUNITY): Payer: Self-pay

## 2019-04-16 DIAGNOSIS — Z9079 Acquired absence of other genital organ(s): Secondary | ICD-10-CM | POA: Insufficient documentation

## 2019-04-16 DIAGNOSIS — Z452 Encounter for adjustment and management of vascular access device: Secondary | ICD-10-CM | POA: Diagnosis present

## 2019-04-16 DIAGNOSIS — Z79899 Other long term (current) drug therapy: Secondary | ICD-10-CM | POA: Diagnosis not present

## 2019-04-16 DIAGNOSIS — Z8547 Personal history of malignant neoplasm of testis: Secondary | ICD-10-CM | POA: Insufficient documentation

## 2019-04-16 DIAGNOSIS — C6290 Malignant neoplasm of unspecified testis, unspecified whether descended or undescended: Secondary | ICD-10-CM

## 2019-04-16 HISTORY — PX: IR REMOVAL TUN ACCESS W/ PORT W/O FL MOD SED: IMG2290

## 2019-04-16 LAB — CBC WITH DIFFERENTIAL/PLATELET
Abs Immature Granulocytes: 0.02 10*3/uL (ref 0.00–0.07)
Basophils Absolute: 0.1 10*3/uL (ref 0.0–0.1)
Basophils Relative: 1 %
Eosinophils Absolute: 0.3 10*3/uL (ref 0.0–0.5)
Eosinophils Relative: 3 %
HCT: 51.7 % (ref 39.0–52.0)
Hemoglobin: 17.1 g/dL — ABNORMAL HIGH (ref 13.0–17.0)
Immature Granulocytes: 0 %
Lymphocytes Relative: 39 %
Lymphs Abs: 2.8 10*3/uL (ref 0.7–4.0)
MCH: 30.3 pg (ref 26.0–34.0)
MCHC: 33.1 g/dL (ref 30.0–36.0)
MCV: 91.5 fL (ref 80.0–100.0)
Monocytes Absolute: 0.8 10*3/uL (ref 0.1–1.0)
Monocytes Relative: 11 %
Neutro Abs: 3.4 10*3/uL (ref 1.7–7.7)
Neutrophils Relative %: 46 %
Platelets: 252 10*3/uL (ref 150–400)
RBC: 5.65 MIL/uL (ref 4.22–5.81)
RDW: 12.3 % (ref 11.5–15.5)
WBC: 7.3 10*3/uL (ref 4.0–10.5)
nRBC: 0 % (ref 0.0–0.2)

## 2019-04-16 LAB — PROTIME-INR
INR: 1.1 (ref 0.8–1.2)
Prothrombin Time: 13.9 seconds (ref 11.4–15.2)

## 2019-04-16 MED ORDER — LIDOCAINE HCL (PF) 1 % IJ SOLN
INTRAMUSCULAR | Status: AC | PRN
  Administered 2019-04-16: 10 mL

## 2019-04-16 MED ORDER — CEFAZOLIN SODIUM-DEXTROSE 2-4 GM/100ML-% IV SOLN
INTRAVENOUS | Status: AC
  Administered 2019-04-16: 2 g via INTRAVENOUS
  Filled 2019-04-16: qty 100

## 2019-04-16 MED ORDER — FENTANYL CITRATE (PF) 100 MCG/2ML IJ SOLN
INTRAMUSCULAR | Status: AC | PRN
  Administered 2019-04-16 (×2): 50 ug via INTRAVENOUS

## 2019-04-16 MED ORDER — MIDAZOLAM HCL 2 MG/2ML IJ SOLN
INTRAMUSCULAR | Status: AC
  Filled 2019-04-16: qty 4

## 2019-04-16 MED ORDER — CEFAZOLIN SODIUM-DEXTROSE 2-4 GM/100ML-% IV SOLN
2.0000 g | INTRAVENOUS | Status: AC
  Administered 2019-04-16: 12:00:00 2 g via INTRAVENOUS

## 2019-04-16 MED ORDER — LIDOCAINE HCL 1 % IJ SOLN
INTRAMUSCULAR | Status: AC
  Filled 2019-04-16: qty 20

## 2019-04-16 MED ORDER — FENTANYL CITRATE (PF) 100 MCG/2ML IJ SOLN
INTRAMUSCULAR | Status: AC
  Filled 2019-04-16: qty 2

## 2019-04-16 MED ORDER — MIDAZOLAM HCL 2 MG/2ML IJ SOLN
INTRAMUSCULAR | Status: AC | PRN
  Administered 2019-04-16 (×2): 1 mg via INTRAVENOUS
  Administered 2019-04-16: 2 mg via INTRAVENOUS

## 2019-04-16 MED ORDER — SODIUM CHLORIDE 0.9 % IV SOLN
INTRAVENOUS | Status: DC
  Administered 2019-04-16: 11:00:00 via INTRAVENOUS

## 2019-04-16 NOTE — H&P (Signed)
Referring Physician(s):  Wyatt Portela  Supervising Physician: Markus Daft  Patient Status:  Greg Peterson  Chief Complaint: "I'm getting my port out"   Subjective: Patient familiar to IR service from prior Port-A-Cath placement on 05/07/2018.  He has a history of seminoma of the right testicle diagnosed in January 2019 with prior orchiectomy and chemotherapy.  He is no longer receiving treatment and has no new disease on imaging.  He presents again today for Port-A-Cath removal.  He currently denies fever, headache, chest pain, dyspnea, cough, abdominal/back pain, nausea, vomiting or bleeding.  Past Medical History:  Diagnosis Date  . Autism spectrum disorder   . Cancer [facility])    testicular   Past Surgical History:  Procedure Laterality Date  . IR FLUORO GUIDE PORT INSERTION RIGHT  05/07/2018  . IR US GUIDE VASC ACCESS RIGHT  05/07/2018  . ORCHIECTOMY Right 01/04/2018   Procedure: RIGHT RADICAL ORCHIECTOMY;  Surgeon: Irine Seal, MD;  Location: [facility];  Service: Urology;  Laterality: Right;      Allergies: Patient has no known allergies.  Medications: Prior to Admission medications   Medication Sig Start Date End Date Taking? Authorizing Provider  citalopram (CELEXA) 10 MG tablet Take 10 mg by mouth daily.    [provider]  gabapentin (NEURONTIN) 300 MG capsule Take 300 mg by mouth at bedtime. Take one at bedtime 09/03/18   [name], [name], MD  HYDROcodone-acetaminophen (NORCO) 5-325 MG tablet Take 1 tablet by mouth every 6 (six) hours as needed for moderate pain. 01/04/18   Irine Seal, MD  hydrocortisone (ANUSOL-HC) 2.5 % rectal cream Place 1 application rectally 2 (two) times daily. 05/20/18   Tanner, Lyndon Code., PA-C  ibuprofen (ADVIL,MOTRIN) 200 MG tablet Take 200 mg by mouth every 6 (six) hours as needed for moderate pain.     [provider]  lidocaine-prilocaine (EMLA) cream Apply 1 application topically as needed. Apply to port area 1-2 hours  before coming to [place] for treatment 09/20/18   Wyatt Portela, MD  prochlorperazine (COMPAZINE) 10 MG tablet Take 1 tablet (10 mg total) by mouth every 6 (six) hours as needed for nausea or vomiting. 05/01/18   Wyatt Portela, MD  triamcinolone cream (KENALOG) 0.1 % Apply 1 application topically 2 (two) times daily. Apply to rash twice daily until rash resolved. 06/12/18   Tanner, Lyndon Code., PA-C     Vital Signs: BP 140/90   Pulse 85   Temp (!) 97.5 F (36.4 C) (Oral)   Resp 18   Ht 5\' 11"  (1.803 m)   Wt 195 lb (88.5 kg)   SpO2 99%   BMI 27.20 kg/m   Physical Exam awake, alert.  Chest clear to auscultation bilaterally.  Clean, intact right chest wall Port-A-Cath.  Heart with regular rate and rhythm.  Abdomen soft, positive bowel sounds, nontender.  No lower extremity edema.  Imaging: No results found.  Labs:  CBC: Recent Labs    09/24/18 1219 01/07/19 1252 04/01/19 1106 04/16/19 1036  WBC 7.5 5.4 7.1 7.3  HGB 15.3 15.3 15.8 17.1*  HCT 46.0 45.9 46.5 51.7  PLT 273 248 251 252    COAGS: Recent Labs    05/07/18 0946  INR 1.08    BMP: Recent Labs    09/17/18 1227 09/24/18 1219 01/07/19 1252 04/01/19 1106  NA 142 143 143 142  K 4.0 4.2 4.0 4.2  CL 107 108 109 107  CO2 27 24 26  26  GLUCOSE 93 86 92 87  BUN 20 19 14  23*  CALCIUM 9.7 9.8 9.5 9.3  CREATININE 1.09 1.19 1.03 1.22  GFRNONAA >60 >60 >60 >60  GFRAA >60 >60 >60 >60    LIVER FUNCTION TESTS: Recent Labs    09/17/18 1227 09/24/18 1219 01/07/19 1252 04/01/19 1106  BILITOT 1.5* 1.4* 2.2* 1.0  AST 46* 36 21 25  ALT 99* 62* 32 41  ALKPHOS 82 90 83 86  PROT 7.5 7.6 7.2 7.2  ALBUMIN 4.4 4.4 4.4 4.3    Assessment and Plan:  Pt with history of seminoma of the right testicle diagnosed in January 2019 with prior orchiectomy and chemotherapy.  He is no longer receiving treatment and has no new disease on imaging.  He presents  today for Port-A-Cath removal.  Details/risks of procedure,  including but not limited to, internal bleeding, infection, injury to adjacent structures discussed with patient and father with their understanding and consent.   Electronically Signed: D. Rowe Robert, PA-C 04/16/2019, 10:47 AM   I spent a total of 20 minutes at the the patient's bedside AND on the patient's hospital floor or unit, greater than 50% of which was counseling/coordinating care for port a  cath removal

## 2019-04-16 NOTE — Procedures (Signed)
Interventional Radiology Procedure:   Indications: Seminoma and completed treatment.   Procedure: Port removal  Findings: Complete removal of port.  Complications: None     EBL: Minimal, less than 10 ml  Plan: Discharge in one hour.  Keep incision dry for at least 24 hours.     [name] R. Anselm Pancoast, MD  Pager: [phone]

## 2019-04-16 NOTE — Discharge Instructions (Signed)
Implanted Port Removal, Care After °This sheet gives you information about how to care for yourself after your procedure. Your health care provider may also give you more specific instructions. If you have problems or questions, contact your health care provider. °What can I expect after the procedure? °After the procedure, it is common to have: °· Soreness or pain near your incision. °· Some swelling or bruising near your incision. °Follow these instructions at home: °Medicines °· Take over-the-counter and prescription medicines only as told by your health care provider. °· If you were prescribed an antibiotic medicine, take it as told by your health care provider. Do not stop taking the antibiotic even if you start to feel better. °Bathing °· Do not take baths, swim, or use a hot tub until your health care provider approves. Ask your health care provider if you can take showers. You may only be allowed to take sponge baths. °Incision care ° °· Follow instructions from your health care provider about how to take care of your incision. Make sure you: °? Wash your hands with soap and water before you change your bandage (dressing). If soap and water are not available, use hand sanitizer. °? Change your dressing as told by your health care provider. °? Keep your dressing dry. °? Leave stitches (sutures), skin glue, or adhesive strips in place. These skin closures may need to stay in place for 2 weeks or longer. If adhesive strip edges start to loosen and curl up, you may trim the loose edges. Do not remove adhesive strips completely unless your health care provider tells you to do that. °· Check your incision area every day for signs of infection. Check for: °? More redness, swelling, or pain. °? More fluid or blood. °? Warmth. °? Pus or a bad smell. °Driving ° °· Do not drive for 24 hours if you were given a medicine to help you relax (sedative) during your procedure. °· If you did not receive a sedative, ask your  health care provider when it is safe to drive. °Activity °· Return to your normal activities as told by your health care provider. Ask your health care provider what activities are safe for you. °· Do not lift anything that is heavier than 10 lb (4.5 kg), or the limit that you are told, until your health care provider says that it is safe. °· Do not do activities that involve lifting your arms over your head. °General instructions °· Do not use any products that contain nicotine or tobacco, such as cigarettes and e-cigarettes. These can delay healing. If you need help quitting, ask your health care provider. °· Keep all follow-up visits as told by your health care provider. This is important. °Contact a health care provider if: °· You have more redness, swelling, or pain around your incision. °· You have more fluid or blood coming from your incision. °· Your incision feels warm to the touch. °· You have pus or a bad smell coming from your incision. °· You have pain that is not relieved by your pain medicine. °Get help right away if you have: °· A fever or chills. °· Chest pain. °· Difficulty breathing. °Summary °· After the procedure, it is common to have pain, soreness, swelling, or bruising near your incision. °· If you were prescribed an antibiotic medicine, take it as told by your health care provider. Do not stop taking the antibiotic even if you start to feel better. °· Do not drive for 24 hours   if you were given a sedative during your procedure. °· Return to your normal activities as told by your health care provider. Ask your health care provider what activities are safe for you. °This information is not intended to replace advice given to you by your health care provider. Make sure you discuss any questions you have with your health care provider. °Document Released: 11/01/2015 Document Revised: 01/03/2018 Document Reviewed: 01/03/2018 °Elsevier Interactive Patient Education © 2019 [employer] ° ° °Moderate Conscious Sedation, Adult, Care After °These instructions provide you with information about caring for yourself after your procedure. Your health care provider may also give you more specific instructions. Your treatment has been planned according to current medical practices, but problems sometimes occur. Call your health care provider if you have any problems or questions after your procedure. °What can I expect after the procedure? °After your procedure, it is common: °· To feel sleepy for several hours. °· To feel clumsy and have poor balance for several hours. °· To have poor judgment for several hours. °· To vomit if you eat too soon. °Follow these instructions at home: °For at least 24 hours after the procedure: ° °· Do not: °? Participate in activities where you could fall or become injured. °? Drive. °? Use heavy machinery. °? Drink alcohol. °? Take sleeping pills or medicines that cause drowsiness. °? Make important decisions or sign legal documents. °? Take care of children on your own. °· Rest. °Eating and drinking °· Follow the diet recommended by your health care provider. °· If you vomit: °? Drink water, juice, or soup when you can drink without vomiting. °? Make sure you have little or no nausea before eating solid foods. °General instructions °· Have a responsible adult stay with you until you are awake and alert. °· Take over-the-counter and prescription medicines only as told by your health care provider. °· If you smoke, do not smoke without supervision. °· Keep all follow-up visits as told by your health care provider. This is important. °Contact a health care provider if: °· You keep feeling nauseous or you keep vomiting. °· You feel light-headed. °· You develop a rash. °· You have a fever. °Get help right away if: °· You have trouble breathing. °This information is not intended to replace advice given to you by your health care provider. Make sure you discuss any questions  you have with your health care provider. °Document Released: 09/10/2013 Document Revised: 04/24/2016 Document Reviewed: 03/11/2016 °Elsevier Interactive Patient Education © 2019 [employer] Inc. ° °

## 2019-10-09 ENCOUNTER — Ambulatory Visit
Admission: RE | Admit: 2019-10-09 | Discharge: 2019-10-09 | Disposition: A | Discharge status: Home / Self Care | Payer: BC Managed Care – PPO | Source: Ambulatory Visit | Attending: Oncology | Admitting: Oncology

## 2019-10-09 ENCOUNTER — Ambulatory Visit
Admission: RE | Admit: 2019-10-09 | Discharge: 2019-10-09 | Disposition: A | Discharge status: Home / Self Care | Payer: BLUE CROSS/BLUE SHIELD | Source: Ambulatory Visit | Attending: Oncology | Admitting: Oncology

## 2019-10-09 ENCOUNTER — Other Ambulatory Visit: Payer: Self-pay

## 2019-10-09 DIAGNOSIS — C6290 Malignant neoplasm of unspecified testis, unspecified whether descended or undescended: Secondary | ICD-10-CM

## 2019-10-09 MED ORDER — IOPAMIDOL (ISOVUE-300) INJECTION 61%
100.0000 mL | Freq: Once | INTRAVENOUS | Status: AC | PRN
  Administered 2019-10-09: 14:00:00 100 mL via INTRAVENOUS

## 2020-05-23 IMAGING — CT CT ABD-PELV W/ CM
2 of 4 series · 16 of 46 positions shown, 18 images · IV contrast (ISOVUE)
Comparison: CT of the abdomen pelvis dated 04/17/2018

CLINICAL DATA: 22-year-old male with abdominal pain. History of
testicular carcinoma.

EXAM:
CT ABDOMEN AND PELVIS WITH CONTRAST
TECHNIQUE: Multidetector CT imaging of the abdomen and pelvis was performed
using the standard protocol following bolus administration of
intravenous contrast.
CONTRAST:  100mL O7S2JE-SKK IOPAMIDOL (O7S2JE-SKK) INJECTION 61%

[Series 2: axial st · axial · 0.71mm/px · z∈[+830,+1295]mm · 13 of 105 slices shown, 15 images]
[im 6/105  soft-tissue]
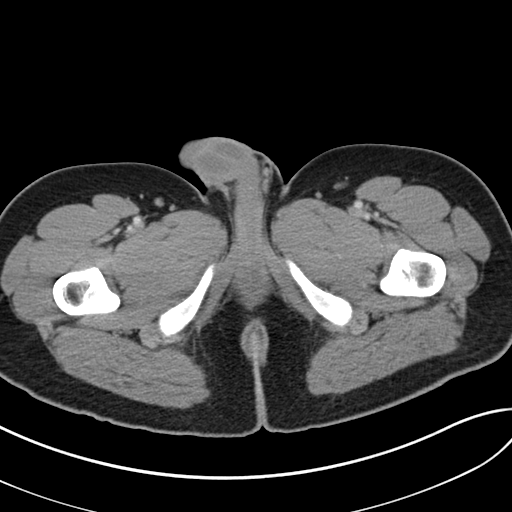
[im 6/105  bone]
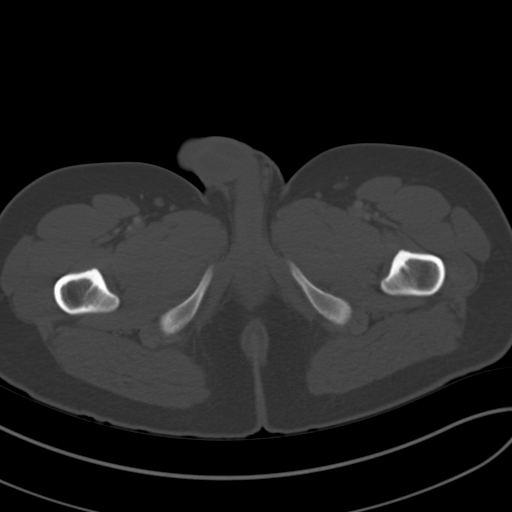
[im 16/105  soft-tissue]
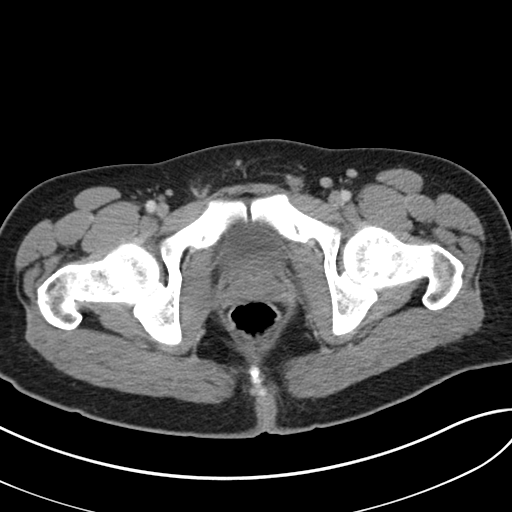
[im 21/105  soft-tissue]
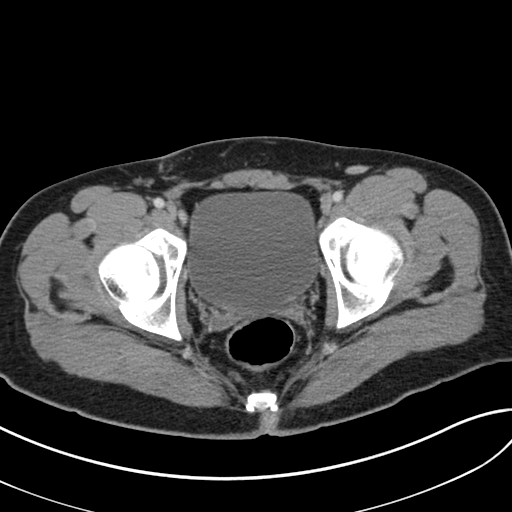
[im 32/105  soft-tissue]
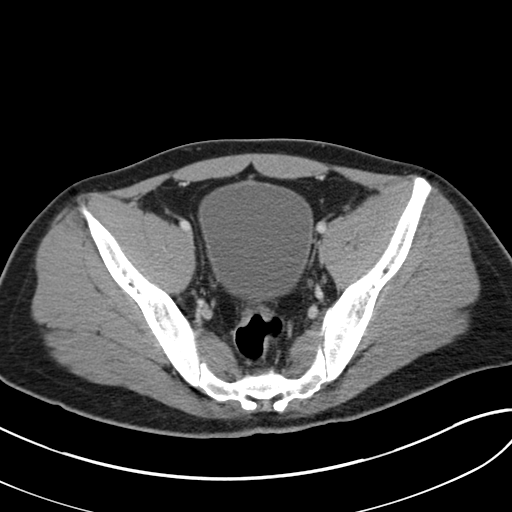
[im 37/105  soft-tissue]
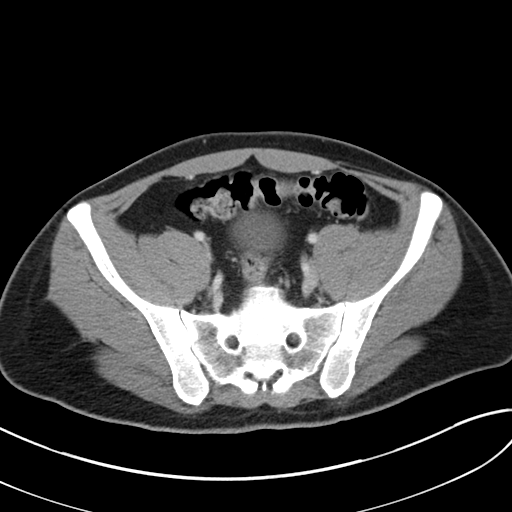
[im 47/105  soft-tissue]
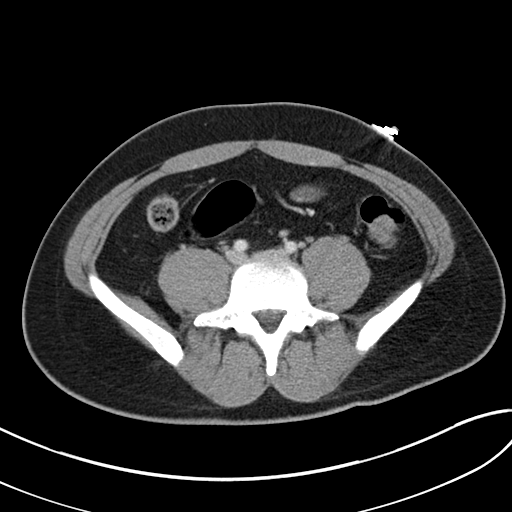
[im 53/105  soft-tissue]
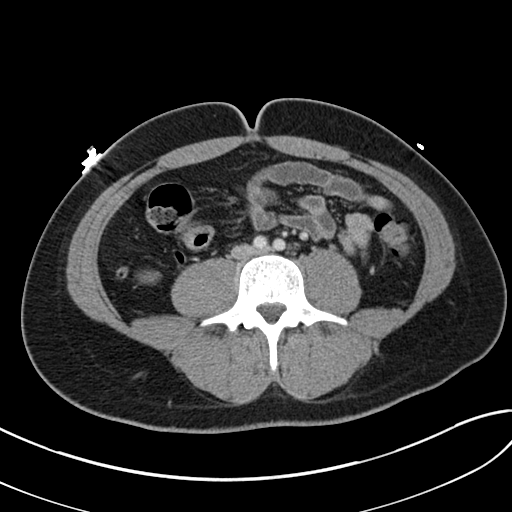
[im 58/105  soft-tissue]
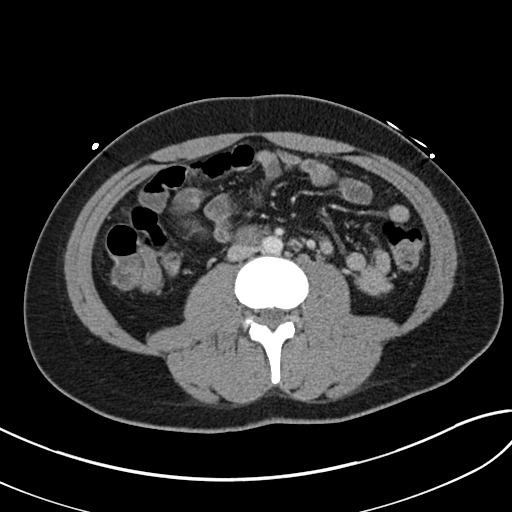
[im 68/105  soft-tissue]
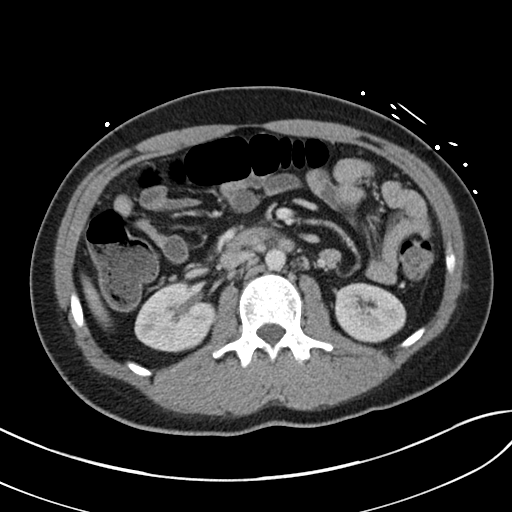
[im 68/105  bone]
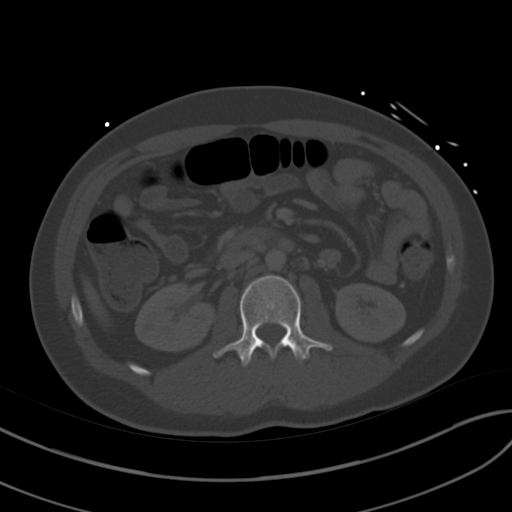
[im 73/105  soft-tissue]
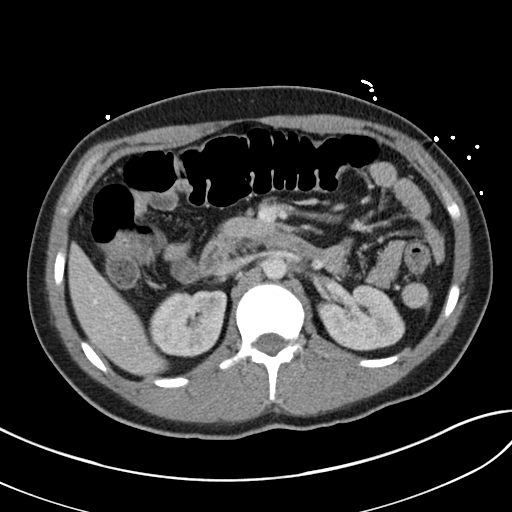
[im 84/105  soft-tissue]
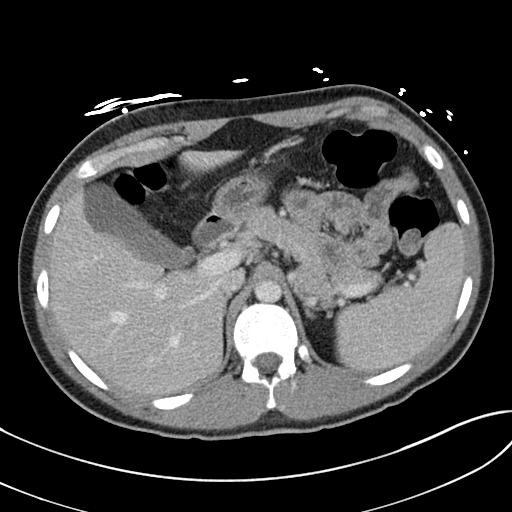
[im 89/105  soft-tissue]
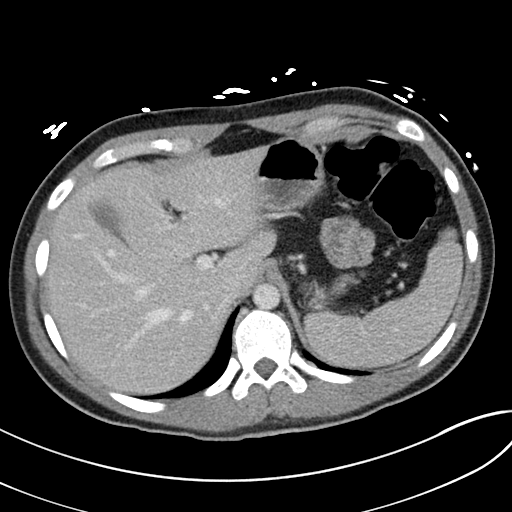
[im 99/105  soft-tissue]
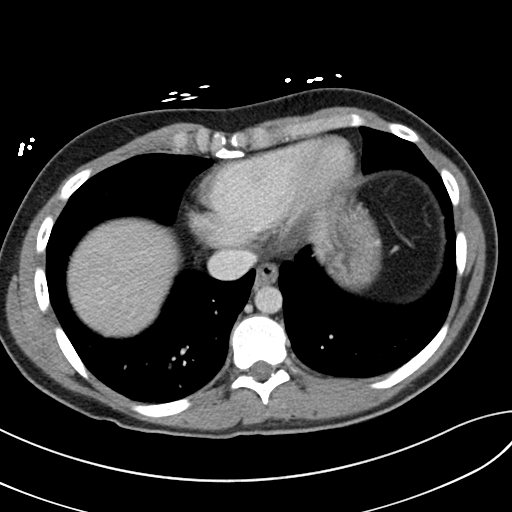

[Series 5: coronal st · coronal · 0.78mm/px · 3 of 88 slices shown]
[im 30/88  soft-tissue]
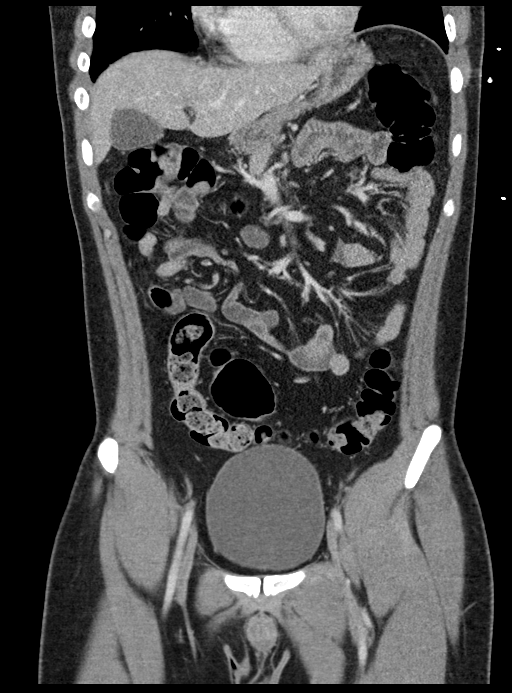
[im 39/88  soft-tissue]
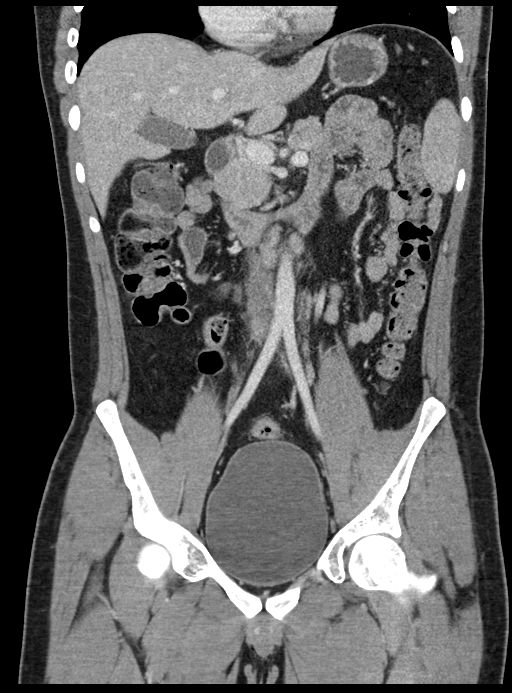
[im 49/88  soft-tissue]
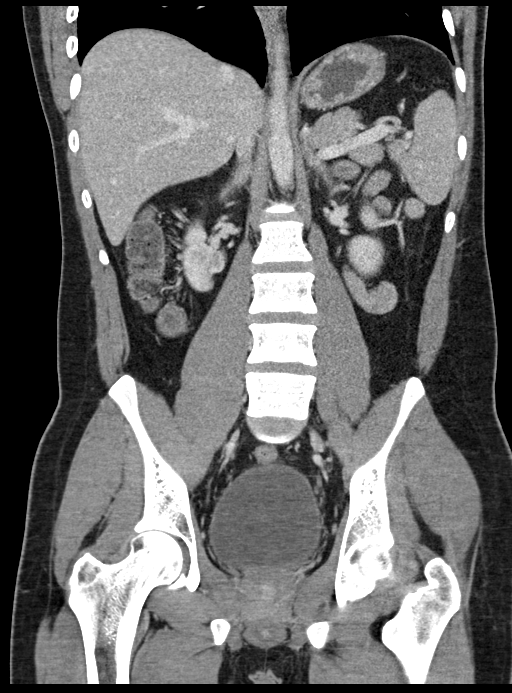

[16 of 46 positions shown; findings below may reference images not displayed]

FINDINGS: Lower chest: The visualized lung bases are clear.

No intra-abdominal free air or free fluid.

Hepatobiliary: No focal liver abnormality is seen. No gallstones,
gallbladder wall thickening, or biliary dilatation.

Pancreas: Unremarkable. No pancreatic ductal dilatation or
surrounding inflammatory changes.

Spleen: Normal in size without focal abnormality.

Adrenals/Urinary Tract: Adrenal glands are unremarkable. Kidneys are
normal, without renal calculi, focal lesion, or hydronephrosis.
Bladder is unremarkable.

Stomach/Bowel: Stomach is within normal limits. Appendix appears
normal. No evidence of bowel wall thickening, distention, or
inflammatory changes.

Vascular/Lymphatic: The abdominal aorta and IVC appear unremarkable.
No portal venous gas. Lower retroperitoneal aortoiliac adenopathy
measures 19 mm and decreased in size compared to the prior CT.

Reproductive: The prostate and seminal vesicles are grossly
unremarkable. No pelvic mass.

Other: None

Musculoskeletal: No acute or significant osseous findings.
IMPRESSION: 1. No acute intra-abdominal or pelvic pathology.
2. Decrease in the size of retroperitoneal adenopathy in the
aortocaval space.

## 2020-09-16 ENCOUNTER — Other Ambulatory Visit: Payer: Self-pay | Admitting: Oncology

## 2020-09-16 DIAGNOSIS — C6290 Malignant neoplasm of unspecified testis, unspecified whether descended or undescended: Secondary | ICD-10-CM

## 2020-09-17 ENCOUNTER — Telehealth: Payer: Self-pay | Admitting: Oncology

## 2020-09-17 NOTE — Telephone Encounter (Signed)
Called pt per 10/14 sch msg - pt father is aware of apts added.

## 2020-10-08 ENCOUNTER — Telehealth: Payer: Self-pay

## 2020-10-08 NOTE — Telephone Encounter (Signed)
Returned patient's fathers call about scheduling patient's CT scan with [facility]. Spoke with Prentiss Bells with [facility] to schedule patient's CT scan. Gave patient's father phone number to [facility] to set up scheduling for scan. Prentiss Bells stated they will reach out to patient's father to set up CT Scan. Spoke with patient's father and advised [facility] will reach out to schedule patient's scan. Patient's father verbalized understanding.

## 2020-11-02 ENCOUNTER — Other Ambulatory Visit: Payer: Self-pay

## 2020-11-02 ENCOUNTER — Inpatient Hospital Stay: Payer: BC Managed Care – PPO | Attending: Oncology

## 2020-11-02 DIAGNOSIS — C6291 Malignant neoplasm of right testis, unspecified whether descended or undescended: Secondary | ICD-10-CM | POA: Diagnosis present

## 2020-11-02 DIAGNOSIS — C6290 Malignant neoplasm of unspecified testis, unspecified whether descended or undescended: Secondary | ICD-10-CM

## 2020-11-02 LAB — CBC WITH DIFFERENTIAL (CANCER CENTER ONLY)
Abs Immature Granulocytes: 0.02 10*3/uL (ref 0.00–0.07)
Basophils Absolute: 0.1 10*3/uL (ref 0.0–0.1)
Basophils Relative: 1 %
Eosinophils Absolute: 0.1 10*3/uL (ref 0.0–0.5)
Eosinophils Relative: 2 %
HCT: 47.8 % (ref 39.0–52.0)
Hemoglobin: 16.2 g/dL (ref 13.0–17.0)
Immature Granulocytes: 0 %
Lymphocytes Relative: 29 %
Lymphs Abs: 1.8 10*3/uL (ref 0.7–4.0)
MCH: 29.3 pg (ref 26.0–34.0)
MCHC: 33.9 g/dL (ref 30.0–36.0)
MCV: 86.6 fL (ref 80.0–100.0)
Monocytes Absolute: 0.7 10*3/uL (ref 0.1–1.0)
Monocytes Relative: 11 %
Neutro Abs: 3.6 10*3/uL (ref 1.7–7.7)
Neutrophils Relative %: 57 %
Platelet Count: 258 10*3/uL (ref 150–400)
RBC: 5.52 MIL/uL (ref 4.22–5.81)
RDW: 11.9 % (ref 11.5–15.5)
WBC Count: 6.3 10*3/uL (ref 4.0–10.5)
nRBC: 0 % (ref 0.0–0.2)

## 2020-11-02 LAB — CMP (CANCER CENTER ONLY)
ALT: 22 U/L (ref 0–44)
AST: 22 U/L (ref 15–41)
Albumin: 4.5 g/dL (ref 3.5–5.0)
Alkaline Phosphatase: 71 U/L (ref 38–126)
Anion gap: 7 (ref 5–15)
BUN: 26 mg/dL — ABNORMAL HIGH (ref 6–20)
CO2: 28 mmol/L (ref 22–32)
Calcium: 10 mg/dL (ref 8.9–10.3)
Chloride: 108 mmol/L (ref 98–111)
Creatinine: 1.32 mg/dL — ABNORMAL HIGH (ref 0.61–1.24)
GFR, Estimated: >60 mL/min (ref >60)
Glucose, Bld: 91 mg/dL (ref 70–99)
Potassium: 4.3 mmol/L (ref 3.5–5.1)
Sodium: 143 mmol/L (ref 135–145)
Total Bilirubin: 1.9 mg/dL — ABNORMAL HIGH (ref 0.3–1.2)
Total Protein: 7.2 g/dL (ref 6.5–8.1)

## 2020-11-02 LAB — LACTATE DEHYDROGENASE: LDH: 254 U/L — ABNORMAL HIGH (ref 98–192)

## 2020-11-03 ENCOUNTER — Ambulatory Visit
Admission: RE | Admit: 2020-11-03 | Discharge: 2020-11-03 | Disposition: A | Discharge status: Home / Self Care | Payer: BC Managed Care – PPO | Source: Ambulatory Visit | Attending: Oncology | Admitting: Oncology

## 2020-11-03 DIAGNOSIS — C6290 Malignant neoplasm of unspecified testis, unspecified whether descended or undescended: Secondary | ICD-10-CM

## 2020-11-03 LAB — AFP TUMOR MARKER: AFP, Serum, Tumor Marker: 1.6 ng/mL (ref 0.0–8.3)

## 2020-11-03 LAB — BETA HCG QUANT (REF LAB): hCG Quant: <1 m[IU]/mL (ref 0–3)

## 2020-11-03 MED ORDER — IOPAMIDOL (ISOVUE-300) INJECTION 61%
100.0000 mL | Freq: Once | INTRAVENOUS | Status: AC | PRN
  Administered 2020-11-03: 100 mL via INTRAVENOUS

## 2020-11-10 ENCOUNTER — Inpatient Hospital Stay: Payer: BC Managed Care – PPO | Attending: Oncology | Admitting: Oncology

## 2020-11-10 ENCOUNTER — Other Ambulatory Visit: Payer: Self-pay

## 2020-11-10 VITALS — BP 121/76 | HR 83 | Temp 97.7°F | Resp 16 | Wt 187.1 lb

## 2020-11-10 DIAGNOSIS — Z8547 Personal history of malignant neoplasm of testis: Secondary | ICD-10-CM | POA: Insufficient documentation

## 2020-11-10 DIAGNOSIS — C6290 Malignant neoplasm of unspecified testis, unspecified whether descended or undescended: Secondary | ICD-10-CM

## 2020-11-10 DIAGNOSIS — Z79899 Other long term (current) drug therapy: Secondary | ICD-10-CM | POA: Diagnosis not present

## 2020-11-10 DIAGNOSIS — Z9221 Personal history of antineoplastic chemotherapy: Secondary | ICD-10-CM | POA: Insufficient documentation

## 2020-11-10 NOTE — Progress Notes (Signed)
Hematology and Oncology Follow Up Visit  Greg Peterson [id number] May 09, 1996 24 y.o. 11/10/2020 2:55 PM [name], [name], MDSasser, [name], MD   Principle Diagnosis: 24 year old with man with stage IIC right testicular seminoma after presenting with a retroperitoneal adenopathy diagnosed in January 2019.     Prior Therapy:  He is status post orchiectomy completed on January 04, 2018.  The final pathology showed pure seminoma with lymphovascular invasion.  He developed recurrent disease with 3.2 cm periaortic lymphadenopathy and elevated hCG of 121 and LDH of 367 in May 2019.  BEP chemotherapy started on 05/20/2018.  He has completed 3 cycles of therapy on July 05, 2018.  Current therapy: Active surveillance  Interim History: Mr. Bogan presents today for repeat evaluation.  Since the last visit, he reports no major changes in his health.  He continues to be active and continues to work at [employer].  He denies any recent hospitalization or illnesses.  He denies any bone pain or pathological fractures.  He denies any abdominal discomfort or flank pain.  He denies any urinary discharge.     Medications: Unchanged on review. Current Outpatient Medications  Medication Sig Dispense Refill  . citalopram (CELEXA) 10 MG tablet Take 10 mg by mouth daily.    Marland Kitchen gabapentin (NEURONTIN) 300 MG capsule Take 300 mg by mouth at bedtime. Take one at bedtime    . HYDROcodone-acetaminophen (NORCO) 5-325 MG tablet Take 1 tablet by mouth every 6 (six) hours as needed for moderate pain. 6 tablet 0  . hydrocortisone (ANUSOL-HC) 2.5 % rectal cream Place 1 application rectally 2 (two) times daily. 30 g 2  . ibuprofen (ADVIL,MOTRIN) 200 MG tablet Take 200 mg by mouth every 6 (six) hours as needed for moderate pain.     Marland Kitchen lidocaine-prilocaine (EMLA) cream Apply 1 application topically as needed. Apply to port area 1-2 hours before coming to [facility] for treatment 30 g 1  . prochlorperazine (COMPAZINE) 10 MG  tablet Take 1 tablet (10 mg total) by mouth every 6 (six) hours as needed for nausea or vomiting. 30 tablet 0  . triamcinolone cream (KENALOG) 0.1 % Apply 1 application topically 2 (two) times daily. Apply to rash twice daily until rash resolved. 45 g 1   No current facility-administered medications for this visit.     Allergies: No Known Allergies      Physical Exam:  Blood pressure 121/76, pulse 83, temperature 97.7 F (36.5 C), temperature source Tympanic, resp. rate 16, weight 187 lb 1.6 oz (84.9 kg), SpO2 98 %.    ECOG: 0    General appearance: Comfortable appearing without any discomfort Head: Normocephalic without any trauma Oropharynx: Mucous membranes are moist and pink without any thrush or ulcers. Eyes: Pupils are equal and round reactive to light. Lymph nodes: No cervical, supraclavicular, inguinal or axillary lymphadenopathy.   Heart:regular rate and rhythm.  S1 and S2 without leg edema. Lung: Clear without any rhonchi or wheezes.  No dullness to percussion. Abdomin: Soft, nontender, nondistended with good bowel sounds.  No hepatosplenomegaly. Musculoskeletal: No joint deformity or effusion.  Full range of motion noted. Neurological: No deficits noted on motor, sensory and deep tendon reflex exam. Skin: No petechial rash or dryness.  Appeared moist.         Lab Results: Lab Results  Component Value Date   WBC 6.3 11/02/2020   HGB 16.2 11/02/2020   HCT 47.8 11/02/2020   MCV 86.6 11/02/2020   PLT 258 11/02/2020  Chemistry      Component Value Date/Time   NA 143 11/02/2020 0957   K 4.3 11/02/2020 0957   CL 108 11/02/2020 0957   CO2 28 11/02/2020 0957   BUN 26 (H) 11/02/2020 0957   CREATININE 1.32 (H) 11/02/2020 0957      Component Value Date/Time   CALCIUM 10.0 11/02/2020 0957   ALKPHOS 71 11/02/2020 0957   AST 22 11/02/2020 0957   ALT 22 11/02/2020 0957   BILITOT 1.9 (H) 11/02/2020 0957      Results for Bezanson, [name] (MRN  [id number]) as of 11/10/2020 14:57  Ref. Range 11/02/2020 09:57  Glucose Latest Ref Range: 70 - 99 mg/dL 91  hCG Quant Latest Ref Range: 0 - 3 mIU/mL <1  AFP, Serum, Tumor Marker Latest Ref Range: 0.0 - 8.3 ng/mL 1.6   IMPRESSION: Stable CT of the chest, abdomen and pelvis. No specific findings identified to suggest residual or recurrent tumor or metastatic disease. Impression and Plan:   24 year old man with:  1.  Stage IIC seminoma diagnosed in January 2019.  He has is currently in remission after achieving complete response with chemotherapy.  The natural course of this disease was discussed at this time.  Imaging studies and laboratory data in the last week were reviewed.  CT scan on December 1 showed no evidence of any metastatic disease or any recurrence of his seminoma.  Laboratory testing also showed no more tumor markers.  Salvage treatment options will only be deferred if he develops relapsed disease including surgical resection of any recurrent lymph nodes versus salvage chemotherapy.  He is agreeable with this plan.   At this time, I have recommended the continued annual visits to complete 5 years and will suspend imaging studies after next year.  2.  IV access: Port-A-Cath removed without complications..   3.  Health maintenance and survivorship: He has no residual complications related to chemotherapy.  He is up-to-date on health maintenance.   4.  Follow-up: In 1 year for repeat imaging studies.  30  minutes were dedicated to this visit. The time was spent on reviewing laboratory data, imaging studies, discussing treatment options,  and answering questions regarding future plan.     Zola Button, MD 12/8/20212:55 PM

## 2021-10-21 ENCOUNTER — Other Ambulatory Visit: Payer: Self-pay | Admitting: Nurse Practitioner

## 2021-11-03 ENCOUNTER — Other Ambulatory Visit: Payer: BC Managed Care – PPO

## 2021-11-09 ENCOUNTER — Inpatient Hospital Stay: Payer: 59 | Attending: Oncology

## 2021-11-09 ENCOUNTER — Ambulatory Visit (HOSPITAL_COMMUNITY)
Admission: RE | Admit: 2021-11-09 | Discharge: 2021-11-09 | Disposition: A | Discharge status: Home / Self Care | Payer: 59 | Source: Ambulatory Visit | Attending: Oncology | Admitting: Oncology

## 2021-11-09 ENCOUNTER — Other Ambulatory Visit: Payer: Self-pay

## 2021-11-09 DIAGNOSIS — C6290 Malignant neoplasm of unspecified testis, unspecified whether descended or undescended: Secondary | ICD-10-CM | POA: Diagnosis present

## 2021-11-09 DIAGNOSIS — Z79899 Other long term (current) drug therapy: Secondary | ICD-10-CM | POA: Insufficient documentation

## 2021-11-09 DIAGNOSIS — C6291 Malignant neoplasm of right testis, unspecified whether descended or undescended: Secondary | ICD-10-CM | POA: Insufficient documentation

## 2021-11-09 DIAGNOSIS — Z9079 Acquired absence of other genital organ(s): Secondary | ICD-10-CM | POA: Insufficient documentation

## 2021-11-09 LAB — CBC WITH DIFFERENTIAL (CANCER CENTER ONLY)
Abs Immature Granulocytes: 0.02 10*3/uL (ref 0.00–0.07)
Basophils Absolute: 0 10*3/uL (ref 0.0–0.1)
Basophils Relative: 1 %
Eosinophils Absolute: 0.2 10*3/uL (ref 0.0–0.5)
Eosinophils Relative: 2 %
HCT: 49 % (ref 39.0–52.0)
Hemoglobin: 17 g/dL (ref 13.0–17.0)
Immature Granulocytes: 0 %
Lymphocytes Relative: 31 %
Lymphs Abs: 2.4 10*3/uL (ref 0.7–4.0)
MCH: 29.5 pg (ref 26.0–34.0)
MCHC: 34.7 g/dL (ref 30.0–36.0)
MCV: 84.9 fL (ref 80.0–100.0)
Monocytes Absolute: 0.7 10*3/uL (ref 0.1–1.0)
Monocytes Relative: 9 %
Neutro Abs: 4.5 10*3/uL (ref 1.7–7.7)
Neutrophils Relative %: 57 %
Platelet Count: 276 10*3/uL (ref 150–400)
RBC: 5.77 MIL/uL (ref 4.22–5.81)
RDW: 12.3 % (ref 11.5–15.5)
WBC Count: 7.9 10*3/uL (ref 4.0–10.5)
nRBC: 0 % (ref 0.0–0.2)

## 2021-11-09 LAB — CMP (CANCER CENTER ONLY)
ALT: 37 U/L (ref 0–44)
AST: 24 U/L (ref 15–41)
Albumin: 4.5 g/dL (ref 3.5–5.0)
Alkaline Phosphatase: 71 U/L (ref 38–126)
Anion gap: 9 (ref 5–15)
BUN: 26 mg/dL — ABNORMAL HIGH (ref 6–20)
CO2: 23 mmol/L (ref 22–32)
Calcium: 9.2 mg/dL (ref 8.9–10.3)
Chloride: 109 mmol/L (ref 98–111)
Creatinine: 1.3 mg/dL — ABNORMAL HIGH (ref 0.61–1.24)
GFR, Estimated: >60 mL/min (ref >60)
Glucose, Bld: 91 mg/dL (ref 70–99)
Potassium: 4.4 mmol/L (ref 3.5–5.1)
Sodium: 141 mmol/L (ref 135–145)
Total Bilirubin: 2.2 mg/dL — ABNORMAL HIGH (ref 0.3–1.2)
Total Protein: 7.2 g/dL (ref 6.5–8.1)

## 2021-11-09 MED ORDER — SODIUM CHLORIDE (PF) 0.9 % IJ SOLN
INTRAMUSCULAR | Status: AC
  Filled 2021-11-09: qty 50

## 2021-11-09 MED ORDER — IOHEXOL 350 MG/ML SOLN
80.0000 mL | Freq: Once | INTRAVENOUS | Status: AC | PRN
  Administered 2021-11-09: 80 mL via INTRAVENOUS

## 2021-11-10 ENCOUNTER — Telehealth: Payer: Self-pay | Admitting: *Deleted

## 2021-11-10 ENCOUNTER — Inpatient Hospital Stay: Payer: 59 | Admitting: Oncology

## 2021-11-10 VITALS — BP 113/73 | HR 62 | Temp 98.2°F | Resp 17 | Ht 71.0 in | Wt 214.9 lb

## 2021-11-10 DIAGNOSIS — C6291 Malignant neoplasm of right testis, unspecified whether descended or undescended: Secondary | ICD-10-CM | POA: Diagnosis present

## 2021-11-10 DIAGNOSIS — C6290 Malignant neoplasm of unspecified testis, unspecified whether descended or undescended: Secondary | ICD-10-CM

## 2021-11-10 DIAGNOSIS — Z9079 Acquired absence of other genital organ(s): Secondary | ICD-10-CM | POA: Diagnosis not present

## 2021-11-10 DIAGNOSIS — Z79899 Other long term (current) drug therapy: Secondary | ICD-10-CM | POA: Diagnosis not present

## 2021-11-10 NOTE — Telephone Encounter (Signed)
Per Dr.Shadad, called pt with message below. Pt verbalized understanding.

## 2021-11-10 NOTE — Progress Notes (Signed)
Hematology and Oncology Follow Up Visit  Greg Peterson [id number] 1996/01/06 25 y.o. 11/10/2021 9:59 AM [name], [name], MDSasser, [name], MD   Principle Diagnosis: 25 year old with man with testicular cancer diagnosed in January 2019.he was found to have stage IIC right testicular seminoma and retroperitoneal adenopathy.  Prior Therapy:  He is status post orchiectomy completed on January 04, 2018.  The final pathology showed pure seminoma with lymphovascular invasion.  He developed recurrent disease with 3.2 cm periaortic lymphadenopathy and elevated hCG of 121 and LDH of 367 in May 2019.  BEP chemotherapy started on 05/20/2018.  He has completed 3 cycles of therapy on July 05, 2018.  Current therapy: Active surveillance  Interim History: [name] returns today for a follow-up visit.  Since the last visit, he reports feeling well without any major complaints.  He denies any nausea, vomiting or abdominal pain.  He denies any excessive fatigue or tiredness.  He denies any abdominal pain or early satiety.  His performance status quality of life remains excellent.     Medications: Updated on review Current Outpatient Medications  Medication Sig Dispense Refill   citalopram (CELEXA) 10 MG tablet Take 10 mg by mouth daily.     gabapentin (NEURONTIN) 300 MG capsule Take 300 mg by mouth at bedtime. Take one at bedtime     HYDROcodone-acetaminophen (NORCO) 5-325 MG tablet Take 1 tablet by mouth every 6 (six) hours as needed for moderate pain. 6 tablet 0   hydrocortisone (ANUSOL-HC) 2.5 % rectal cream Place 1 application rectally 2 (two) times daily. 30 g 2   ibuprofen (ADVIL,MOTRIN) 200 MG tablet Take 200 mg by mouth every 6 (six) hours as needed for moderate pain.      lidocaine-prilocaine (EMLA) cream Apply 1 application topically as needed. Apply to port area 1-2 hours before coming to [facility] for treatment 30 g 1   prochlorperazine (COMPAZINE) 10 MG tablet Take 1 tablet (10 mg  total) by mouth every 6 (six) hours as needed for nausea or vomiting. 30 tablet 0   triamcinolone cream (KENALOG) 0.1 % Apply 1 application topically 2 (two) times daily. Apply to rash twice daily until rash resolved. 45 g 1   No current facility-administered medications for this visit.     Allergies: No Known Allergies      Physical Exam:   Blood pressure 113/73, pulse 62, temperature 98.2 F (36.8 C), temperature source Temporal, resp. rate 17, height 5\' 11"  (1.803 m), weight 214 lb 14.4 oz (97.5 kg), SpO2 95 %.    ECOG: 0    General appearance: Alert, awake without any distress. Head: Atraumatic without abnormalities Oropharynx: Without any thrush or ulcers. Eyes: No scleral icterus. Lymph nodes: No lymphadenopathy noted in the cervical, supraclavicular, or axillary nodes Heart:regular rate and rhythm, without any murmurs or gallops.   Lung: Clear to auscultation without any rhonchi, wheezes or dullness to percussion. Abdomin: Soft, nontender without any shifting dullness or ascites. Musculoskeletal: No clubbing or cyanosis. Neurological: No motor or sensory deficits. Skin: No rashes or lesions.         Lab Results: Lab Results  Component Value Date   WBC 7.9 11/09/2021   HGB 17.0 11/09/2021   HCT 49.0 11/09/2021   MCV 84.9 11/09/2021   PLT 276 11/09/2021     Chemistry      Component Value Date/Time   NA 141 11/09/2021 1454   K 4.4 11/09/2021 1454   CL 109 11/09/2021 1454   CO2 23  11/09/2021 1454   BUN 26 (H) 11/09/2021 1454   CREATININE 1.30 (H) 11/09/2021 1454      Component Value Date/Time   CALCIUM 9.2 11/09/2021 1454   ALKPHOS 71 11/09/2021 1454   AST 24 11/09/2021 1454   ALT 37 11/09/2021 1454   BILITOT 2.2 (H) 11/09/2021 14583         25 year old man with:  1.  Left testicular seminoma diagnosed in 2019.  He was found to have stage IIC disease and had a complete response to chemotherapy.  He is currently on active surveillance  without any evidence of relapsed disease.  CT scan obtained on 11/09/2021 has been completed but report is not available.  We will await these results and to communicate these findings to him.  He continues to be in remission, annual surveillance is recommended.  Relapsed disease will require salvage radiation, chemotherapy.  He is agreeable with this plan I will communicate these results to him in the near future.  The plan is to continue annual surveillance for 5 years.   2.  Follow-up: He will return in 1 year for a follow-up evaluation.  30  minutes were spent on this encounter.  The time was dedicated to reviewing laboratory data, disease status update and future plan of care management.     Zola Button, MD 12/8/20229:59 AM

## 2021-11-10 NOTE — Telephone Encounter (Signed)
-----   Message from Wyatt Portela, MD sent at 11/10/2021  3:43 PM EST ----- Please let him know his CT scan is normal without evidence of cancer dissipated and discussed today.

## 2022-01-30 DIAGNOSIS — R635 Abnormal weight gain: Secondary | ICD-10-CM | POA: Diagnosis not present

## 2022-01-30 DIAGNOSIS — R69 Illness, unspecified: Secondary | ICD-10-CM | POA: Diagnosis not present

## 2022-01-30 DIAGNOSIS — F3342 Major depressive disorder, recurrent, in full remission: Secondary | ICD-10-CM | POA: Diagnosis not present

## 2022-01-30 DIAGNOSIS — Z6830 Body mass index (BMI) 30.0-30.9, adult: Secondary | ICD-10-CM | POA: Diagnosis not present

## 2022-01-30 DIAGNOSIS — F840 Autistic disorder: Secondary | ICD-10-CM | POA: Diagnosis not present

## 2022-05-29 DIAGNOSIS — F3342 Major depressive disorder, recurrent, in full remission: Secondary | ICD-10-CM | POA: Diagnosis not present

## 2022-05-29 DIAGNOSIS — R635 Abnormal weight gain: Secondary | ICD-10-CM | POA: Diagnosis not present

## 2022-05-29 DIAGNOSIS — R69 Illness, unspecified: Secondary | ICD-10-CM | POA: Diagnosis not present

## 2022-05-29 DIAGNOSIS — F840 Autistic disorder: Secondary | ICD-10-CM | POA: Diagnosis not present

## 2022-05-29 DIAGNOSIS — H6120 Impacted cerumen, unspecified ear: Secondary | ICD-10-CM | POA: Diagnosis not present

## 2022-05-29 DIAGNOSIS — Z6831 Body mass index (BMI) 31.0-31.9, adult: Secondary | ICD-10-CM | POA: Diagnosis not present

## 2022-06-28 DIAGNOSIS — Z6832 Body mass index (BMI) 32.0-32.9, adult: Secondary | ICD-10-CM | POA: Diagnosis not present

## 2022-06-28 DIAGNOSIS — F411 Generalized anxiety disorder: Secondary | ICD-10-CM | POA: Diagnosis not present

## 2022-06-28 DIAGNOSIS — R69 Illness, unspecified: Secondary | ICD-10-CM | POA: Diagnosis not present

## 2022-06-28 DIAGNOSIS — F410 Panic disorder [episodic paroxysmal anxiety] without agoraphobia: Secondary | ICD-10-CM | POA: Diagnosis not present

## 2022-11-01 ENCOUNTER — Ambulatory Visit (HOSPITAL_COMMUNITY): Payer: 59

## 2022-11-01 ENCOUNTER — Other Ambulatory Visit: Payer: Self-pay | Admitting: Oncology

## 2022-11-01 ENCOUNTER — Encounter: Payer: Self-pay | Admitting: Oncology

## 2022-11-01 DIAGNOSIS — C6290 Malignant neoplasm of unspecified testis, unspecified whether descended or undescended: Secondary | ICD-10-CM

## 2022-11-08 ENCOUNTER — Ambulatory Visit (HOSPITAL_COMMUNITY): Payer: 59

## 2022-11-09 ENCOUNTER — Inpatient Hospital Stay: Payer: 59 | Attending: Family Medicine

## 2022-11-16 ENCOUNTER — Ambulatory Visit: Payer: 59 | Admitting: Oncology

## 2022-12-01 ENCOUNTER — Ambulatory Visit
Admission: RE | Admit: 2022-12-01 | Discharge: 2022-12-01 | Disposition: A | Discharge status: Home / Self Care | Payer: 59 | Source: Ambulatory Visit | Attending: Oncology | Admitting: Oncology

## 2022-12-01 DIAGNOSIS — C6290 Malignant neoplasm of unspecified testis, unspecified whether descended or undescended: Secondary | ICD-10-CM

## 2022-12-01 MED ORDER — IOPAMIDOL (ISOVUE-300) INJECTION 61%
100.0000 mL | Freq: Once | INTRAVENOUS | Status: AC | PRN
  Administered 2022-12-01: 100 mL via INTRAVENOUS

## 2022-12-05 ENCOUNTER — Telehealth: Payer: Self-pay | Admitting: *Deleted

## 2022-12-05 NOTE — Telephone Encounter (Signed)
Contacted by Malachy Mood at [facility] Radiology with verbal call report CT- C/A/P 12/01/22:  1. New sclerotic foci in the C7 and T1 vertebral bodies, suspicious for osseous metastatic disease. Suggest further evaluation with pre and postcontrast MRI spine. 2. No evidence new or progressive known osseous metastasis in the chest, abdomen or pelvis. Stable prominent retroperitoneal lymph nodes. 3. Moderate volume of formed stool throughout the colon. Correlate for constipation.  Message routed to Dr. [name]

## 2022-12-06 ENCOUNTER — Other Ambulatory Visit: Payer: Self-pay | Admitting: Oncology

## 2022-12-06 DIAGNOSIS — C6290 Malignant neoplasm of unspecified testis, unspecified whether descended or undescended: Secondary | ICD-10-CM

## 2022-12-13 ENCOUNTER — Other Ambulatory Visit: Payer: Self-pay

## 2022-12-13 ENCOUNTER — Inpatient Hospital Stay: Payer: 59

## 2022-12-13 ENCOUNTER — Inpatient Hospital Stay: Payer: 59 | Attending: Family Medicine | Admitting: Oncology

## 2022-12-13 VITALS — BP 120/79 | HR 92 | Temp 97.9°F | Resp 20 | Wt 222.4 lb

## 2022-12-13 DIAGNOSIS — M899 Disorder of bone, unspecified: Secondary | ICD-10-CM | POA: Diagnosis not present

## 2022-12-13 DIAGNOSIS — Z8547 Personal history of malignant neoplasm of testis: Secondary | ICD-10-CM | POA: Diagnosis not present

## 2022-12-13 DIAGNOSIS — Z9221 Personal history of antineoplastic chemotherapy: Secondary | ICD-10-CM | POA: Insufficient documentation

## 2022-12-13 DIAGNOSIS — C6290 Malignant neoplasm of unspecified testis, unspecified whether descended or undescended: Secondary | ICD-10-CM

## 2022-12-13 LAB — CBC WITH DIFFERENTIAL (CANCER CENTER ONLY)
Abs Immature Granulocytes: 0.01 10*3/uL (ref 0.00–0.07)
Basophils Absolute: 0.1 10*3/uL (ref 0.0–0.1)
Basophils Relative: 1 %
Eosinophils Absolute: 0.3 10*3/uL (ref 0.0–0.5)
Eosinophils Relative: 4 %
HCT: 49.9 % (ref 39.0–52.0)
Hemoglobin: 16.9 g/dL (ref 13.0–17.0)
Immature Granulocytes: 0 %
Lymphocytes Relative: 35 %
Lymphs Abs: 2.7 10*3/uL (ref 0.7–4.0)
MCH: 29.3 pg (ref 26.0–34.0)
MCHC: 33.9 g/dL (ref 30.0–36.0)
MCV: 86.5 fL (ref 80.0–100.0)
Monocytes Absolute: 0.7 10*3/uL (ref 0.1–1.0)
Monocytes Relative: 9 %
Neutro Abs: 4.1 10*3/uL (ref 1.7–7.7)
Neutrophils Relative %: 51 %
Platelet Count: 347 10*3/uL (ref 150–400)
RBC: 5.77 MIL/uL (ref 4.22–5.81)
RDW: 12.8 % (ref 11.5–15.5)
WBC Count: 7.9 10*3/uL (ref 4.0–10.5)
nRBC: 0 % (ref 0.0–0.2)

## 2022-12-13 LAB — CMP (CANCER CENTER ONLY)
ALT: 43 U/L (ref 0–44)
AST: 23 U/L (ref 15–41)
Albumin: 4.8 g/dL (ref 3.5–5.0)
Alkaline Phosphatase: 80 U/L (ref 38–126)
Anion gap: 5 (ref 5–15)
BUN: 21 mg/dL — ABNORMAL HIGH (ref 6–20)
CO2: 29 mmol/L (ref 22–32)
Calcium: 10 mg/dL (ref 8.9–10.3)
Chloride: 107 mmol/L (ref 98–111)
Creatinine: 1.32 mg/dL — ABNORMAL HIGH (ref 0.61–1.24)
GFR, Estimated: >60 mL/min (ref >60)
Glucose, Bld: 85 mg/dL (ref 70–99)
Potassium: 4 mmol/L (ref 3.5–5.1)
Sodium: 141 mmol/L (ref 135–145)
Total Bilirubin: 1.5 mg/dL — ABNORMAL HIGH (ref 0.3–1.2)
Total Protein: 7.7 g/dL (ref 6.5–8.1)

## 2022-12-13 LAB — LACTATE DEHYDROGENASE: LDH: 166 U/L (ref 98–192)

## 2022-12-13 NOTE — Progress Notes (Signed)
Hematology and Oncology Follow Up Visit  Greg Peterson [id number] [birth year] 27 y.o. 12/13/2022 1:32 PM Greg Peterson, MDSasser, [name], MD   Principle Diagnosis: 27 year old with man with stage IIC testicular cancer diagnosed in January 2019. He was found to have right testicular seminoma with retroperitoneal adenopathy.  Prior Therapy:  He is status post orchiectomy completed on January 04, 2018.  The final pathology showed pure seminoma with lymphovascular invasion.  He developed recurrent disease with 3.2 cm periaortic lymphadenopathy and elevated hCG of 121 and LDH of 367 in May 2019.  BEP chemotherapy started on 05/20/2018.  He has completed 3 cycles of therapy on July 05, 2018.  Current therapy: Active surveillance  Interim History: Mr. Greg Peterson presents today for evaluation with his father.  Since last visit, he reports no major complaints.  He denies any nausea vomiting or abdominal pain.  He denies any neck pain or shoulder pain or any bone pain at this time.  He remains active and attends to activities of daily living.  He denies any hospitalizations or illnesses.     Medications: Updated on review Current Outpatient Medications  Medication Sig Dispense Refill   citalopram (CELEXA) 10 MG tablet Take 10 mg by mouth daily.     gabapentin (NEURONTIN) 300 MG capsule Take 300 mg by mouth at bedtime. Take one at bedtime     HYDROcodone-acetaminophen (NORCO) 5-325 MG tablet Take 1 tablet by mouth every 6 (six) hours as needed for moderate pain. 6 tablet 0   hydrocortisone (ANUSOL-HC) 2.5 % rectal cream Place 1 application rectally 2 (two) times daily. 30 g 2   ibuprofen (ADVIL,MOTRIN) 200 MG tablet Take 200 mg by mouth every 6 (six) hours as needed for moderate pain.      lidocaine-prilocaine (EMLA) cream Apply 1 application topically as needed. Apply to port area 1-2 hours before coming to [facility] for treatment 30 g 1   prochlorperazine (COMPAZINE) 10 MG tablet Take 1 tablet  (10 mg total) by mouth every 6 (six) hours as needed for nausea or vomiting. 30 tablet 0   triamcinolone cream (KENALOG) 0.1 % Apply 1 application topically 2 (two) times daily. Apply to rash twice daily until rash resolved. 45 g 1   No current facility-administered medications for this visit.     Allergies: No Known Allergies      Physical Exam:   Blood pressure 120/79, pulse 92, temperature 97.9 F (36.6 C), resp. rate 20, weight 222 lb 6.4 oz (100.9 kg), SpO2 98 %.    ECOG: 0   General appearance: Comfortable appearing without any discomfort Head: Normocephalic without any trauma Oropharynx: Mucous membranes are moist and pink without any thrush or ulcers. Eyes: Pupils are equal and round reactive to light. Lymph nodes: No cervical, supraclavicular, inguinal or axillary lymphadenopathy.   Heart:regular rate and rhythm.  S1 and S2 without leg edema. Lung: Clear without any rhonchi or wheezes.  No dullness to percussion. Abdomin: Soft, nontender, nondistended with good bowel sounds.  No hepatosplenomegaly. Musculoskeletal: No joint deformity or effusion.  Full range of motion noted. Neurological: No deficits noted on motor, sensory and deep tendon reflex exam. Skin: No petechial rash or dryness.  Appeared moist.          Lab Results: Lab Results  Component Value Date   WBC 7.9 12/13/2022   HGB 16.9 12/13/2022   HCT 49.9 12/13/2022   MCV 86.5 12/13/2022   PLT 347 12/13/2022     Chemistry  Component Value Date/Time   NA 141 11/09/2021 1454   K 4.4 11/09/2021 1454   CL 109 11/09/2021 1454   CO2 23 11/09/2021 1454   BUN 26 (H) 11/09/2021 1454   CREATININE 1.30 (H) 11/09/2021 1454      Component Value Date/Time   CALCIUM 9.2 11/09/2021 1454   ALKPHOS 71 11/09/2021 1454   AST 24 11/09/2021 1454   ALT 37 11/09/2021 1454   BILITOT 2.2 (H) 11/09/2021 1454       Narrative & Impression  CLINICAL DATA:  Testicular cancer, seminoma follow-up *  Tracking Code: BO *   EXAM: CT CHEST, ABDOMEN, AND PELVIS WITH CONTRAST   TECHNIQUE: Multidetector CT imaging of the chest, abdomen and pelvis was performed following the standard protocol during bolus administration of intravenous contrast.   RADIATION DOSE REDUCTION: This exam was performed according to the departmental dose-optimization program which includes automated exposure control, adjustment of the mA and/or kV according to patient size and/or use of iterative reconstruction technique.   CONTRAST:  165m ISOVUE-300 IOPAMIDOL (ISOVUE-300) INJECTION 61%   COMPARISON:  Multiple priors including CT November 09, 2021   FINDINGS: CT CHEST FINDINGS   Cardiovascular: Normal caliber thoracic aorta. No central pulmonary embolus on this nondedicated study. Normal size heart. No significant pericardial effusion/thickening.   Mediastinum/Nodes: Similar feathery soft tissue stranding in the anterior mediastinum which conforms to underlying vasculature compatible with thymic tissue. No pathologically enlarged mediastinal, hilar or axillary lymph nodes. No suspicious thyroid nodule.   Lungs/Pleura: No suspicious pulmonary nodules or masses. No pleural effusion. No pneumothorax.   Musculoskeletal: New sclerotic foci in the C7 and T1 vertebral bodies on image 3/2 and 6/2 (sagittal image 101/4). Stable small sclerotic density in the sternum on image 51/6 unchanged dating back to at least 2019 compatible with a benign finding.   CT ABDOMEN PELVIS FINDINGS   Hepatobiliary: No suspicious hepatic lesion. Gallbladder is unremarkable. No biliary ductal dilation.   Pancreas: No pancreatic ductal dilation or evidence of acute inflammation.   Spleen: No splenomegaly or focal splenic lesion.   Adrenals/Urinary Tract: Bilateral adrenal glands appear normal. No hydronephrosis. Right lower pole renal scarring. Kidneys demonstrate symmetric enhancement. Urinary bladder is unremarkable for  degree of distension.   Stomach/Bowel: Radiopaque enteric contrast material traverses the rectum. Stomach is unremarkable for degree of distension. No pathologic dilation of small or large bowel. The appendix and terminal ileum appear normal. No evidence of acute bowel inflammation. Moderate volume of formed stool throughout the colon.   Vascular/Lymphatic: Normal caliber abdominal aorta. Smooth IVC contours.   No pathologically enlarged abdominal or pelvic lymph nodes. Prominent retroperitoneal lymph nodes are similar prior with a preaortic lymph node measuring 6 mm in short axis on image 75/2, unchanged.   Reproductive: Prostate is unremarkable.   Other: No significant abdominopelvic free fluid.   Musculoskeletal: Stable small sclerotic lesions in the anterior L3 vertebral body, right hemi sternum and left acetabulum stable back to 2019 compatible with a benign etiology likely reflecting bone islands.   IMPRESSION: 1. New sclerotic foci in the C7 and T1 vertebral bodies, suspicious for osseous metastatic disease. Suggest further evaluation with pre and postcontrast MRI spine. 2. No evidence new or progressive known osseous metastasis in the chest, abdomen or pelvis. Stable prominent retroperitoneal lymph nodes. 3. Moderate volume of formed stool throughout the colon. Correlate for constipation.   These results will be called to the ordering clinician or representative by the Radiologist Assistant, and communication documented in the PACS  or [employer].       27 year old man with:  1.  Right testicular seminoma presented with stage IIC, retroperitoneal adenopathy in 2019.  He achieved complete response to chemotherapy.   He is currently on active surveillance after achieving complete response to chemotherapy in 2019.  CT scan obtained on January 01, 2022 was personally reviewed and showed no evidence to suggest metastatic disease in the abdomen and pelvis  including no adenopathy.  The risk of relapse was assessed at this time and I recommended annual surveillance for 1 more year and suspending imaging studies after the.  2.  Sclerotic lesion at C7 and T1 vertebral body: Etiology is unclear at this time.  Metastatic testicular cancer is extremely unlikely at this time.  Benign etiology versus different malignancy needs to be evaluated at this time.  I recommended obtaining MRI which is currently pending scheduling.  Once these results are available, I will communicate these findings regarding next steps.  3.  Follow-up: I have tentatively scheduled him a 73-monthfollow-up [facility]  This will be changed if his workup of these sclerotic lesions yield different results.  30  minutes were dedicated to this visit.  The time was spent on updating his disease status, treatment choices and outlining future plan of care discussion.     FZola Button MD 1/10/20241:32 PM

## 2022-12-14 LAB — AFP TUMOR MARKER: AFP, Serum, Tumor Marker: 2.8 ng/mL (ref 0.0–5.7)

## 2022-12-14 LAB — BETA HCG QUANT (REF LAB): hCG Quant: <1 m[IU]/mL (ref 0–3)

## 2022-12-25 DIAGNOSIS — R030 Elevated blood-pressure reading, without diagnosis of hypertension: Secondary | ICD-10-CM | POA: Diagnosis not present

## 2022-12-25 DIAGNOSIS — R69 Illness, unspecified: Secondary | ICD-10-CM | POA: Diagnosis not present

## 2022-12-25 DIAGNOSIS — Z6831 Body mass index (BMI) 31.0-31.9, adult: Secondary | ICD-10-CM | POA: Diagnosis not present

## 2022-12-25 DIAGNOSIS — F411 Generalized anxiety disorder: Secondary | ICD-10-CM | POA: Diagnosis not present

## 2022-12-25 DIAGNOSIS — F410 Panic disorder [episodic paroxysmal anxiety] without agoraphobia: Secondary | ICD-10-CM | POA: Diagnosis not present

## 2023-01-01 ENCOUNTER — Ambulatory Visit
Admission: RE | Admit: 2023-01-01 | Discharge: 2023-01-01 | Disposition: A | Discharge status: Home / Self Care | Payer: 59 | Source: Ambulatory Visit | Attending: Oncology | Admitting: Oncology

## 2023-01-01 DIAGNOSIS — Z8547 Personal history of malignant neoplasm of testis: Secondary | ICD-10-CM | POA: Diagnosis not present

## 2023-01-01 DIAGNOSIS — M5124 Other intervertebral disc displacement, thoracic region: Secondary | ICD-10-CM | POA: Diagnosis not present

## 2023-01-01 DIAGNOSIS — C6290 Malignant neoplasm of unspecified testis, unspecified whether descended or undescended: Secondary | ICD-10-CM

## 2023-01-01 MED ORDER — GADOPICLENOL 0.5 MMOL/ML IV SOLN
10.0000 mL | Freq: Once | INTRAVENOUS | Status: AC | PRN
  Administered 2023-01-01: 10 mL via INTRAVENOUS

## 2023-01-03 DIAGNOSIS — F419 Anxiety disorder, unspecified: Secondary | ICD-10-CM | POA: Diagnosis not present

## 2023-01-03 DIAGNOSIS — F840 Autistic disorder: Secondary | ICD-10-CM | POA: Diagnosis not present

## 2023-02-14 DIAGNOSIS — F840 Autistic disorder: Secondary | ICD-10-CM | POA: Diagnosis not present

## 2023-02-14 DIAGNOSIS — F419 Anxiety disorder, unspecified: Secondary | ICD-10-CM | POA: Diagnosis not present

## 2023-03-14 DIAGNOSIS — F419 Anxiety disorder, unspecified: Secondary | ICD-10-CM | POA: Diagnosis not present

## 2023-03-14 DIAGNOSIS — F840 Autistic disorder: Secondary | ICD-10-CM | POA: Diagnosis not present

## 2023-03-28 DIAGNOSIS — R030 Elevated blood-pressure reading, without diagnosis of hypertension: Secondary | ICD-10-CM | POA: Diagnosis not present

## 2023-03-28 DIAGNOSIS — F410 Panic disorder [episodic paroxysmal anxiety] without agoraphobia: Secondary | ICD-10-CM | POA: Diagnosis not present

## 2023-03-28 DIAGNOSIS — F840 Autistic disorder: Secondary | ICD-10-CM | POA: Diagnosis not present

## 2023-03-28 DIAGNOSIS — Z6831 Body mass index (BMI) 31.0-31.9, adult: Secondary | ICD-10-CM | POA: Diagnosis not present

## 2023-03-28 DIAGNOSIS — F411 Generalized anxiety disorder: Secondary | ICD-10-CM | POA: Diagnosis not present

## 2023-04-04 DIAGNOSIS — F840 Autistic disorder: Secondary | ICD-10-CM | POA: Diagnosis not present

## 2023-04-04 DIAGNOSIS — F419 Anxiety disorder, unspecified: Secondary | ICD-10-CM | POA: Diagnosis not present

## 2023-04-14 DIAGNOSIS — F840 Autistic disorder: Secondary | ICD-10-CM | POA: Diagnosis not present

## 2023-04-14 DIAGNOSIS — F419 Anxiety disorder, unspecified: Secondary | ICD-10-CM | POA: Diagnosis not present

## 2023-05-23 DIAGNOSIS — F419 Anxiety disorder, unspecified: Secondary | ICD-10-CM | POA: Diagnosis not present

## 2023-05-23 DIAGNOSIS — F840 Autistic disorder: Secondary | ICD-10-CM | POA: Diagnosis not present

## 2023-06-17 NOTE — Progress Notes (Signed)
[facility] [address] [place] [address]   Clinic Day:  06/18/23   Referring physician: Estanislado Pandy, MD  Patient Care Team: Estanislado Pandy, MD as PCP - General (Family Medicine) Doreatha Massed, MD as Medical Oncologist (Medical Oncology)   ASSESSMENT & PLAN:   Assessment:  1.  Stage IIc testicular seminoma: - Right orchiectomy (01/04/2018): 5.2 cm seminoma confined within tunica albuginea, margins negative, focal lymphovascular involvement by tumor.  PT2 pNX. - 04/17/2018: CT AP-new abdominal retroperitoneal lymphadenopathy in the aortocaval space, largest lymph node measuring 3.2 cm. - Elevated hCG 121, LDH 367 in May 2019. - 3 cycles of BEP from 05/20/2018 through 07/01/2018  2.  Social/family history: - He lives at home with his parents.  He has autism spectrum disorder. - He works at [employer] in [place].  Non-smoker.  No chemical exposure. - Paternal grandfather and maternal grandfather had prostate cancer.  Plan:  1.  Stage IIc testicular seminoma: - He does not have any clinical signs or symptoms of recurrence. - Reviewed MRI of the thoracic and cervical spine from 01/01/2023 which did not show any evidence of recurrence. - Reviewed LFTs today, shows elevated ALT of 54.  Total bilirubin is 1.9, elevated since 2019.  LDH is normal at 168.  Beta-hCG is pending from today.  Last beta-hCG was normal. - Recommend follow-up in 6 months with repeat CT CAP with contrast and tumor markers.  It will be his last imaging.  Orders Placed This Encounter  Procedures   CT CHEST ABDOMEN PELVIS W CONTRAST    [employer]/CVs epic WT 219 No Diabetes No allergies to contrast dye No kidney cancer/kidney surgery/yes both kidneys No special needs/No spinal cord stimulator/body  injector/glucose monitor/port attached) If you need to cancel your appt, please do so 24 hrs prior to your appointment to avoid getting charged a no show fee of $75 PT is aware/PT verbal  understood instructions given Pansy with Reuel Boom @ [phone] Dr's office to make pt aware to p/u contrast-pansy    Standing Status:   Future    Standing Expiration Date:   06/17/2024    Order Specific Question:   If indicated for the ordered procedure, I authorize the administration of contrast media per Radiology protocol    Answer:   Yes    Order Specific Question:   Does the patient have a contrast media/X-ray dye allergy?    Answer:   No    Order Specific Question:   Preferred imaging location?    Answer:   [facility]    Order Specific Question:   Release to patient    Answer:   Immediate    Order Specific Question:   If indicated for the ordered procedure, I authorize the administration of oral contrast media per Radiology protocol    Answer:   Yes   Lactate dehydrogenase    Standing Status:   Future    Number of Occurrences:   1    Standing Expiration Date:   06/17/2024   AFP tumor marker    Standing Status:   Future    Number of Occurrences:   1    Standing Expiration Date:   06/17/2024   HCG, tumor marker    Standing Status:   Future    Number of Occurrences:   1    Standing Expiration Date:   06/17/2024   Hepatic function panel    Standing Status:   Future  Number of Occurrences:   1    Standing Expiration Date:   06/17/2024   CBC with Differential    Standing Status:   Future    Standing Expiration Date:   06/17/2024   Comprehensive metabolic panel    Standing Status:   Future    Standing Expiration Date:   06/17/2024   Lactate dehydrogenase    Standing Status:   Future    Standing Expiration Date:   06/17/2024   HCG, tumor marker    Standing Status:   Future    Standing Expiration Date:   06/17/2024   AFP tumor marker    Standing Status:   Future    Standing Expiration Date:   06/17/2024      Alben Deeds Teague,acting as a scribe for Doreatha Massed, MD.,have documented all relevant documentation on the behalf of Doreatha Massed, MD,as directed  by  Doreatha Massed, MD while in the presence of Doreatha Massed, MD.   I, Doreatha Massed MD, have reviewed the above documentation for accuracy and completeness, and I agree with the above.   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    Prior Therapy:  He is status post orchiectomy completed on January 04, 2018.  The final pathology showed pure seminoma with lymphovascular invasion.   He developed recurrent disease with 3.2 cm periaortic lymphadenopathy and elevated hCG of 121 and LDH of 367 in May 2019.   BEP chemotherapy started on 05/20/2018.  He has completed 3 cycles of therapy on July 05, 2018.  Current Therapy:  Active surveillance    HISTORY OF PRESENT ILLNESS:   Oncology History  Testis cancer (HCC)  05/01/2018 Initial Diagnosis   Testis cancer (HCC)   05/20/2018 - 07/05/2018 Chemotherapy   The patient had palonosetron (ALOXI) injection 0.25 mg, 0.25 mg, Intravenous,  Once, 3 of 3 cycles Administration: 0.25 mg (05/20/2018), 0.25 mg (05/22/2018), 0.25 mg (05/24/2018), 0.25 mg (06/10/2018), 0.25 mg (06/12/2018), 0.25 mg (06/14/2018), 0.25 mg (07/01/2018), 0.25 mg (07/03/2018), 0.25 mg (07/05/2018) bleomycin (BLEOCIN) 30 Units in sodium chloride 0.9 % 50 mL chemo infusion, 30 Units, Intravenous,  Once, 2 of 2 cycles Administration: 30 Units (05/21/2018), 30 Units (05/28/2018), 30 Units (06/04/2018), 30 Units (06/18/2018) etoposide (VEPESID) 200 mg in sodium chloride 0.9 % 500 mL chemo infusion, 200 mg, Intravenous,  Once, 1 of 1 cycle Administration: 200 mg (05/24/2018) CISplatin (PLATINOL) 41 mg in sodium chloride 0.9 % 250 mL chemo infusion, 20 mg/m2 = 41 mg, Intravenous,  Once, 3 of 3 cycles Administration: 41 mg (05/20/2018), 41 mg (05/21/2018), 41 mg  (05/22/2018), 41 mg (05/23/2018), 41 mg (05/24/2018), 41 mg (06/11/2018), 41 mg (06/12/2018), 41 mg (06/13/2018), 41 mg (06/14/2018), 41 mg (07/01/2018), 41 mg (07/02/2018), 41 mg (07/03/2018), 41 mg (07/04/2018), 41 mg (07/05/2018) etoposide (VEPESID) 200 mg in sodium chloride 0.9 % 500 mL chemo infusion, 98 mg/m2 = 210 mg, Intravenous,  Once, 3 of 3 cycles Administration: 200 mg (05/20/2018), 200 mg (05/21/2018), 200 mg (05/22/2018), 200 mg (05/23/2018), 200 mg (06/10/2018), 200 mg (06/11/2018), 200 mg (06/12/2018), 200 mg (06/13/2018), 200 mg (06/14/2018), 200 mg (07/01/2018), 200 mg (07/02/2018), 200 mg (07/03/2018), 200 mg (07/04/2018), 200 mg (07/05/2018) fosaprepitant (EMEND) 150 mg, dexamethasone (DECADRON) 12 mg in sodium chloride 0.9 % 145 mL IVPB, , Intravenous,  Once, 3 of  3 cycles Administration:  (05/20/2018),  (05/22/2018),  (05/24/2018),  (06/10/2018),  (06/12/2018),  (06/14/2018),  (07/01/2018),  (07/03/2018),  (07/05/2018)  for chemotherapy treatment.        Greg Peterson is a 27 y.o. male presenting to clinic today for evaluation of stage IIC testicular cancer and right testicular seminoma with retroperitoneal adenopathy for transfer of care from Dr. Clelia Croft.  Patient's last labs on 12/13/22 showed CBC, LDH, AFP tumor marker, and Beta hCG quant WNL. His CMP on 12/13/22 showed abnormal BUN at 21, creatinine at 1.32, and total bilirubin at 1.5.   Today, he states that he is doing well overall. His appetite level is at 100%. His energy level is at 80%. He is accompanied by his father.   He denies any neck pain, new pains, side effects from chemotherapy 4 years ago including neuropathy, or weight loss. He denies tinnitus, hearing loss, or difficulty hearing.   He currently works at [employer] in [place] and resides with his parents. He denies any tobacco use or chemical exposures. His paternal and maternal grandfather had prostate cancer.   He sees his PCP every 3 months with Dr. Neita Carp and notes that he does not typically do blood  work with the patient. His father reports that patient had blood tests done annually by Dr. Michiel Cowboy for the last 2 years.   PAST MEDICAL HISTORY:   Past Medical History: Past Medical History:  Diagnosis Date   Autism spectrum disorder    Cancer [facility])    testicular    Surgical History: Past Surgical History:  Procedure Laterality Date   IR FLUORO GUIDE PORT INSERTION RIGHT  05/07/2018   IR REMOVAL TUN ACCESS W/ PORT W/O FL MOD SED  04/16/2019   IR US GUIDE VASC ACCESS RIGHT  05/07/2018   ORCHIECTOMY Right 01/04/2018   Procedure: RIGHT RADICAL ORCHIECTOMY;  Surgeon: Bjorn Pippin, MD;  Location: [facility];  Service: Urology;  Laterality: Right;    Social History: Social History   Socioeconomic History   Marital status: Single    Spouse name: Not on file   Number of children: Not on file   Years of education: Not on file   Highest education level: Not on file  Occupational History   Not on file  Tobacco Use   Smoking status: Never   Smokeless tobacco: Never  Vaping Use   Vaping status: Never Used  Substance and Sexual Activity   Alcohol use: No   Drug use: No   Sexual activity: Not on file  Other Topics Concern   Not on file  Social History Narrative   Not on file   Social Determinants of Health   Financial Resource Strain: Not on file  Food Insecurity: Not on file  Transportation Needs: Not on file  Physical Activity: Not on file  Stress: Not on file  Social Connections: Not on file  Intimate Partner Violence: Not on file    Family History: No family history on file.  Current Medications:  Current Outpatient Medications:    ALPRAZolam (XANAX) 0.5 MG tablet, Take 0.5 mg by mouth 3 (three) times daily as needed., Disp: , Rfl:    citalopram (CELEXA) 10 MG tablet, Take 10 mg by mouth daily., Disp: , Rfl:    gabapentin (NEURONTIN) 300 MG capsule, Take 300 mg by mouth at bedtime. Take one at bedtime, Disp: , Rfl:    ibuprofen (ADVIL,MOTRIN) 200 MG  tablet, Take 200 mg by mouth every 6 (six) hours as needed  for moderate pain. , Disp: , Rfl:    Allergies: No Known Allergies  REVIEW OF SYSTEMS:   Review of Systems  Constitutional:  Negative for chills, fatigue and fever.  HENT:   Negative for lump/mass, mouth sores, nosebleeds, sore throat and trouble swallowing.   Eyes:  Negative for eye problems.  Respiratory:  Negative for cough and shortness of breath.   Cardiovascular:  Negative for chest pain, leg swelling and palpitations.  Gastrointestinal:  Negative for abdominal pain, constipation, diarrhea, nausea and vomiting.  Genitourinary:  Negative for bladder incontinence, difficulty urinating, dysuria, frequency, hematuria and nocturia.   Musculoskeletal:  Negative for arthralgias, back pain, flank pain, myalgias and neck pain.  Skin:  Negative for itching and rash.  Neurological:  Negative for dizziness, headaches and numbness.  Hematological:  Does not bruise/bleed easily.  Psychiatric/Behavioral:  Negative for depression, sleep disturbance and suicidal ideas. The patient is not nervous/anxious.   All other systems reviewed and are negative.    VITALS:   Blood pressure 112/77, pulse 80, temperature (!) 97.3 F (36.3 C), temperature source Oral, resp. rate 17, weight 219 lb 9.6 oz (99.6 kg), SpO2 96%.  Wt Readings from Last 3 Encounters:  06/18/23 219 lb 9.6 oz (99.6 kg)  12/13/22 222 lb 6.4 oz (100.9 kg)  11/10/21 214 lb 14.4 oz (97.5 kg)    Body mass index is 30.63 kg/m.  Performance status (ECOG): 0 - Asymptomatic  PHYSICAL EXAM:   Physical Exam Vitals and nursing note reviewed. Exam conducted with a chaperone present.  Constitutional:      Appearance: Normal appearance.  Cardiovascular:     Rate and Rhythm: Normal rate and regular rhythm.     Pulses: Normal pulses.     Heart sounds: Normal heart sounds.  Pulmonary:     Effort: Pulmonary effort is normal.     Breath sounds: Normal breath sounds.  Abdominal:      Palpations: Abdomen is soft. There is no hepatomegaly, splenomegaly or mass.     Tenderness: There is no abdominal tenderness.  Musculoskeletal:     Right lower leg: No edema.     Left lower leg: No edema.  Lymphadenopathy:     Cervical: No cervical adenopathy.     Right cervical: No superficial, deep or posterior cervical adenopathy.    Left cervical: No superficial, deep or posterior cervical adenopathy.     Upper Body:     Right upper body: No supraclavicular or axillary adenopathy.     Left upper body: No supraclavicular or axillary adenopathy.  Neurological:     General: No focal deficit present.     Mental Status: He is alert and oriented to person, place, and time.  Psychiatric:        Mood and Affect: Mood normal.        Behavior: Behavior normal.     LABS:      Latest Ref Rng & Units 12/13/2022    1:04 PM 11/09/2021    2:54 PM 11/02/2020    9:57 AM  CBC  WBC 4.0 - 10.5 K/uL 7.9  7.9  6.3   Hemoglobin 13.0 - 17.0 g/dL 46.9  62.9  52.8   Hematocrit 39.0 - 52.0 % 49.9  49.0  47.8   Platelets 150 - 400 K/uL 347  276  258       Latest Ref Rng & Units 06/18/2023    1:47 PM 12/13/2022    1:04 PM 11/09/2021    2:54 PM  CMP  Glucose 70 - 99 mg/dL  85  91   BUN 6 - 20 mg/dL  21  26   Creatinine 4.09 - 1.24 mg/dL  8.11  9.14   Sodium 782 - 145 mmol/L  141  141   Potassium 3.5 - 5.1 mmol/L  4.0  4.4   Chloride 98 - 111 mmol/L  107  109   CO2 22 - 32 mmol/L  29  23   Calcium 8.9 - 10.3 mg/dL  95.6  9.2   Total Protein 6.5 - 8.1 g/dL 8.1  7.7  7.2   Total Bilirubin 0.3 - 1.2 mg/dL 1.9  1.5  2.2   Alkaline Phos 38 - 126 U/L 82  80  71   AST 15 - 41 U/L 33  23  24   ALT 0 - 44 U/L 54  43  37      No results found for: "CEA1", "CEA" / No results found for: "CEA1", "CEA" No results found for: "PSA1" No results found for: "OZH086" No results found for: "CAN125"  No results found for: "TOTALPROTELP", "ALBUMINELP", "A1GS", "A2GS", "BETS", "BETA2SER", "GAMS", "MSPIKE",  "SPEI" No results found for: "TIBC", "FERRITIN", "IRONPCTSAT" Lab Results  Component Value Date   LDH 168 06/18/2023   LDH 166 12/13/2022   LDH 254 (H) 11/02/2020     STUDIES:   No results found.

## 2023-06-18 ENCOUNTER — Inpatient Hospital Stay: Payer: 59 | Attending: Hematology | Admitting: Hematology

## 2023-06-18 ENCOUNTER — Inpatient Hospital Stay: Payer: 59

## 2023-06-18 VITALS — BP 112/77 | HR 80 | Temp 97.3°F | Resp 17 | Wt 219.6 lb

## 2023-06-18 DIAGNOSIS — C6290 Malignant neoplasm of unspecified testis, unspecified whether descended or undescended: Secondary | ICD-10-CM

## 2023-06-18 DIAGNOSIS — Z8547 Personal history of malignant neoplasm of testis: Secondary | ICD-10-CM | POA: Diagnosis not present

## 2023-06-18 DIAGNOSIS — F840 Autistic disorder: Secondary | ICD-10-CM | POA: Insufficient documentation

## 2023-06-18 DIAGNOSIS — Z8042 Family history of malignant neoplasm of prostate: Secondary | ICD-10-CM | POA: Diagnosis not present

## 2023-06-18 LAB — HEPATIC FUNCTION PANEL
ALT: 54 U/L — ABNORMAL HIGH (ref 0–44)
AST: 33 U/L (ref 15–41)
Albumin: 4.7 g/dL (ref 3.5–5.0)
Alkaline Phosphatase: 82 U/L (ref 38–126)
Bilirubin, Direct: 0.1 mg/dL (ref 0.0–0.2)
Indirect Bilirubin: 1.8 mg/dL — ABNORMAL HIGH (ref 0.3–0.9)
Total Bilirubin: 1.9 mg/dL — ABNORMAL HIGH (ref 0.3–1.2)
Total Protein: 8.1 g/dL (ref 6.5–8.1)

## 2023-06-18 LAB — LACTATE DEHYDROGENASE: LDH: 168 U/L (ref 98–192)

## 2023-06-18 NOTE — Patient Instructions (Signed)
[facility]  Discharge Instructions  You were seen and examined today by Dr. Ellin Saba. Dr. Ellin Saba is a medical oncologist, meaning that he specializes in the treatment of cancer diagnoses. Dr. Ellin Saba discussed your past medical history, family history of cancers, and the events that led to you being here today.   You were referred for ongoing management of your previous testicular cancer.  Continue routine follow-ups.  Follow-up as scheduled.  Thank you for choosing [facility] to provide your oncology and hematology care.   To afford each patient quality time with our provider, please arrive at least 15 minutes before your scheduled appointment time. You may need to reschedule your appointment if you arrive late (10 or more minutes). Arriving late affects you and other patients whose appointments are after yours.  Also, if you miss three or more appointments without notifying the office, you may be dismissed from the clinic at the provider's discretion.    Again, thank you for choosing [facility].  Our hope is that these requests will decrease the amount of time that you wait before being seen by our physicians.   If you have a lab appointment with the Cancer Center - please note that after April 8th, all labs will be drawn in the cancer center.  You do not have to check in or register with the main entrance as you have in the past but will complete your check-in at the cancer center.            _____________________________________________________________  Should you have questions after your visit to [facility], please contact our office at [phone] and follow the prompts.  Our office hours are 8:00 a.m. to 4:30 p.m. Monday - Thursday and 8:00 a.m. to 2:30 p.m. Friday.  Please note that voicemails left after 4:00 p.m. may not be returned until the following business day.  We are closed weekends  and all major holidays.  You do have access to a nurse 24-7, just call the main number to the clinic [phone] and do not press any options, hold on the line and a nurse will answer the phone.    For prescription refill requests, have your pharmacy contact our office and allow 72 hours.    Masks are no longer required in the cancer centers. If you would like for your care team to wear a mask while they are taking care of you, please let them know. You may have one support person who is at least 28 years old accompany you for your appointments.

## 2023-06-20 DIAGNOSIS — F419 Anxiety disorder, unspecified: Secondary | ICD-10-CM | POA: Diagnosis not present

## 2023-06-20 DIAGNOSIS — F840 Autistic disorder: Secondary | ICD-10-CM | POA: Diagnosis not present

## 2023-06-20 LAB — AFP TUMOR MARKER: AFP, Serum, Tumor Marker: 2.4 ng/mL (ref 0.0–5.7)

## 2023-06-20 LAB — BETA HCG QUANT (REF LAB): hCG Quant: <1 m[IU]/mL (ref 0–3)

## 2023-06-25 DIAGNOSIS — F840 Autistic disorder: Secondary | ICD-10-CM | POA: Diagnosis not present

## 2023-06-25 DIAGNOSIS — R030 Elevated blood-pressure reading, without diagnosis of hypertension: Secondary | ICD-10-CM | POA: Diagnosis not present

## 2023-06-25 DIAGNOSIS — Z6831 Body mass index (BMI) 31.0-31.9, adult: Secondary | ICD-10-CM | POA: Diagnosis not present

## 2023-06-25 DIAGNOSIS — F411 Generalized anxiety disorder: Secondary | ICD-10-CM | POA: Diagnosis not present

## 2023-06-25 DIAGNOSIS — F410 Panic disorder [episodic paroxysmal anxiety] without agoraphobia: Secondary | ICD-10-CM | POA: Diagnosis not present

## 2023-09-28 ENCOUNTER — Encounter: Payer: Self-pay | Admitting: Hematology

## 2023-10-01 DIAGNOSIS — Z6832 Body mass index (BMI) 32.0-32.9, adult: Secondary | ICD-10-CM | POA: Diagnosis not present

## 2023-10-01 DIAGNOSIS — F840 Autistic disorder: Secondary | ICD-10-CM | POA: Diagnosis not present

## 2023-10-01 DIAGNOSIS — F411 Generalized anxiety disorder: Secondary | ICD-10-CM | POA: Diagnosis not present

## 2023-10-01 DIAGNOSIS — F410 Panic disorder [episodic paroxysmal anxiety] without agoraphobia: Secondary | ICD-10-CM | POA: Diagnosis not present

## 2023-10-01 DIAGNOSIS — R030 Elevated blood-pressure reading, without diagnosis of hypertension: Secondary | ICD-10-CM | POA: Diagnosis not present

## 2023-10-01 DIAGNOSIS — E6609 Other obesity due to excess calories: Secondary | ICD-10-CM | POA: Diagnosis not present

## 2023-11-30 ENCOUNTER — Inpatient Hospital Stay: Payer: 59 | Attending: Hematology

## 2023-11-30 DIAGNOSIS — C6290 Malignant neoplasm of unspecified testis, unspecified whether descended or undescended: Secondary | ICD-10-CM | POA: Diagnosis not present

## 2023-11-30 LAB — CBC WITH DIFFERENTIAL/PLATELET
Abs Immature Granulocytes: 0.01 10*3/uL (ref 0.00–0.07)
Basophils Absolute: 0.1 10*3/uL (ref 0.0–0.1)
Basophils Relative: 1 %
Eosinophils Absolute: 0.3 10*3/uL (ref 0.0–0.5)
Eosinophils Relative: 5 %
HCT: 50.4 % (ref 39.0–52.0)
Hemoglobin: 17.2 g/dL — ABNORMAL HIGH (ref 13.0–17.0)
Immature Granulocytes: 0 %
Lymphocytes Relative: 37 %
Lymphs Abs: 2.6 10*3/uL (ref 0.7–4.0)
MCH: 29.8 pg (ref 26.0–34.0)
MCHC: 34.1 g/dL (ref 30.0–36.0)
MCV: 87.3 fL (ref 80.0–100.0)
Monocytes Absolute: 0.4 10*3/uL (ref 0.1–1.0)
Monocytes Relative: 5 %
Neutro Abs: 3.6 10*3/uL (ref 1.7–7.7)
Neutrophils Relative %: 52 %
Platelets: 285 10*3/uL (ref 150–400)
RBC: 5.77 MIL/uL (ref 4.22–5.81)
RDW: 12.3 % (ref 11.5–15.5)
WBC: 6.9 10*3/uL (ref 4.0–10.5)
nRBC: 0 % (ref 0.0–0.2)

## 2023-11-30 LAB — COMPREHENSIVE METABOLIC PANEL
ALT: 59 U/L — ABNORMAL HIGH (ref 0–44)
AST: 34 U/L (ref 15–41)
Albumin: 4.4 g/dL (ref 3.5–5.0)
Alkaline Phosphatase: 72 U/L (ref 38–126)
Anion gap: 7 (ref 5–15)
BUN: 17 mg/dL (ref 6–20)
CO2: 27 mmol/L (ref 22–32)
Calcium: 9.8 mg/dL (ref 8.9–10.3)
Chloride: 106 mmol/L (ref 98–111)
Creatinine, Ser: 1.34 mg/dL — ABNORMAL HIGH (ref 0.61–1.24)
GFR, Estimated: >60 mL/min (ref >60)
Glucose, Bld: 109 mg/dL — ABNORMAL HIGH (ref 70–99)
Potassium: 4.4 mmol/L (ref 3.5–5.1)
Sodium: 140 mmol/L (ref 135–145)
Total Bilirubin: 1.8 mg/dL — ABNORMAL HIGH (ref <1.2)
Total Protein: 7.7 g/dL (ref 6.5–8.1)

## 2023-11-30 LAB — LACTATE DEHYDROGENASE: LDH: 171 U/L (ref 98–192)

## 2023-12-01 LAB — BETA HCG QUANT (REF LAB): hCG Quant: <1 m[IU]/mL (ref 0–3)

## 2023-12-01 LAB — AFP TUMOR MARKER: AFP, Serum, Tumor Marker: <1.8 ng/mL (ref 0.0–5.7)

## 2023-12-03 ENCOUNTER — Ambulatory Visit
Admission: RE | Admit: 2023-12-03 | Discharge: 2023-12-03 | Disposition: A | Discharge status: Home / Self Care | Payer: 59 | Source: Ambulatory Visit | Attending: Hematology | Admitting: Hematology

## 2023-12-03 DIAGNOSIS — C6290 Malignant neoplasm of unspecified testis, unspecified whether descended or undescended: Secondary | ICD-10-CM

## 2023-12-03 MED ORDER — IOPAMIDOL (ISOVUE-300) INJECTION 61%
200.0000 mL | Freq: Once | INTRAVENOUS | Status: AC | PRN
  Administered 2023-12-03: 90 mL via INTRAVENOUS

## 2023-12-09 NOTE — Progress Notes (Signed)
 [facility] [address], [place] [address]    Clinic Day:  12/10/2023  Referring physician: Atilano Deward ORN, MD  Patient Care Team: Atilano Deward ORN, MD as PCP - General (Family Medicine) Rogers Hai, MD as Medical Oncologist (Medical Oncology)   ASSESSMENT & PLAN:   Assessment: 1.  Stage IIc testicular seminoma: - Right orchiectomy (01/04/2018): 5.2 cm seminoma confined within tunica albuginea, margins negative, focal lymphovascular involvement by tumor.  PT2 pNX. - 04/17/2018: CT AP-new abdominal retroperitoneal lymphadenopathy in the aortocaval space, largest lymph node measuring 3.2 cm. - Elevated hCG 121, LDH 367 in May 2019. - 3 cycles of BEP from 05/20/2018 through 07/01/2018   2.  Social/family history: - He lives at home with his parents.  He has autism spectrum disorder. - He works at [employer] in [place].  Non-smoker.  No chemical exposure. - Paternal grandfather and maternal grandfather had prostate cancer.    Plan: 1.  Stage IIc testicular seminoma: - He does not have any clinical signs/symptoms of recurrence. - Reviewed labs from 11/30/2023: Mildly elevated ALT stable.  Rest of LFTs normal.  CBC normal.  Tumor markers including beta-hCG, AFP and LDH were normal. - Reviewed CT CAP from 12/03/2023: No evidence of recurrence or metastatic disease. - He has completed 5 years of surveillance.  I will not do any further imaging unless clinical condition dictates.  RTC 1 year for follow-up with repeat tumor markers.    Orders Placed This Encounter  Procedures   CBC with Differential    Standing Status:   Future    Expected Date:   12/08/2024    Expiration Date:   12/09/2024   Comprehensive metabolic panel    Standing Status:   Future    Expected Date:   12/08/2024    Expiration Date:   12/09/2024   Lactate dehydrogenase    Standing Status:   Future    Expected Date:   12/08/2024    Expiration Date:   12/09/2024   AFP tumor marker    Standing  Status:   Future    Expected Date:   12/08/2024    Expiration Date:   12/09/2024   HCG, tumor marker    Standing Status:   Future    Expected Date:   12/08/2024    Expiration Date:   12/09/2024      I,Katie Daubenspeck,acting as a scribe for Hai Rogers, MD.,have documented all relevant documentation on the behalf of Hai Rogers, MD,as directed by  Hai Rogers, MD while in the presence of Hai Rogers, MD.   I, Hai Rogers MD, have reviewed the above documentation for accuracy and completeness, and I agree with the above.   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    Prior Therapy: 1. Orchiectomy, 01/04/18 2. BEP, 3 cycles, 05/20/18 - 07/01/18  Current Therapy:  surveillance   HISTORY OF PRESENT ILLNESS:   Oncology History  Testis cancer (HCC)  05/01/2018 Initial Diagnosis   Testis cancer (HCC)   05/20/2018 - 07/05/2018 Chemotherapy   The patient had palonosetron  (ALOXI ) injection 0.25 mg, 0.25 mg, Intravenous,  Once, 3 of 3 cycles Administration: 0.25 mg (05/20/2018), 0.25 mg (05/22/2018), 0.25 mg (05/24/2018), 0.25 mg (06/10/2018), 0.25 mg (06/12/2018), 0.25 mg (06/14/2018), 0.25 mg (07/01/2018), 0.25 mg (07/03/2018), 0.25 mg (  07/05/2018) bleomycin  (BLEOCIN) 30 Units in sodium chloride  0.9 % 50 mL chemo infusion, 30 Units, Intravenous,  Once, 2 of 2 cycles Administration: 30 Units (05/21/2018), 30 Units (05/28/2018), 30 Units (06/04/2018), 30 Units (06/18/2018) etoposide  (VEPESID ) 200 mg in sodium chloride  0.9 % 500 mL chemo infusion, 200 mg, Intravenous,  Once, 1 of 1 cycle Administration: 200 mg (05/24/2018) CISplatin  (PLATINOL ) 41 mg in sodium chloride  0.9 % 250 mL chemo infusion, 20 mg/m2 = 41 mg, Intravenous,  Once, 3 of 3 cycles Administration: 41 mg  (05/20/2018), 41 mg (05/21/2018), 41 mg (05/22/2018), 41 mg (05/23/2018), 41 mg (05/24/2018), 41 mg (06/11/2018), 41 mg (06/12/2018), 41 mg (06/13/2018), 41 mg (06/14/2018), 41 mg (07/01/2018), 41 mg (07/02/2018), 41 mg (07/03/2018), 41 mg (07/04/2018), 41 mg (07/05/2018) etoposide  (VEPESID ) 200 mg in sodium chloride  0.9 % 500 mL chemo infusion, 98 mg/m2 = 210 mg, Intravenous,  Once, 3 of 3 cycles Administration: 200 mg (05/20/2018), 200 mg (05/21/2018), 200 mg (05/22/2018), 200 mg (05/23/2018), 200 mg (06/10/2018), 200 mg (06/11/2018), 200 mg (06/12/2018), 200 mg (06/13/2018), 200 mg (06/14/2018), 200 mg (07/01/2018), 200 mg (07/02/2018), 200 mg (07/03/2018), 200 mg (07/04/2018), 200 mg (07/05/2018) fosaprepitant  (EMEND ) 150 mg, dexamethasone  (DECADRON ) 12 mg in sodium chloride  0.9 % 145 mL IVPB, , Intravenous,  Once, 3 of 3 cycles Administration:  (05/20/2018),  (05/22/2018),  (05/24/2018),  (06/10/2018),  (06/12/2018),  (06/14/2018),  (07/01/2018),  (07/03/2018),  (07/05/2018)  for chemotherapy treatment.       INTERVAL HISTORY:   Greg Peterson is a 28 y.o. male presenting to clinic today for follow up of stage IIC testicular cancer and right testicular seminoma with retroperitoneal adenopathy. He was last seen by me on 06/18/23.  Since his last visit, he underwent surveillance CT C/A/P on 12/03/23 showing: stable exam without evidence of recurrent or metastatic disease.  Today, he states that he is doing well overall. His appetite level is at 100%. His energy level is at 75%.  PAST MEDICAL HISTORY:   Past Medical History: Past Medical History:  Diagnosis Date   Autism spectrum disorder    Cancer [facility])    testicular    Surgical History: Past Surgical History:  Procedure Laterality Date   IR FLUORO GUIDE PORT INSERTION RIGHT  05/07/2018   IR REMOVAL TUN ACCESS W/ PORT W/O FL MOD SED  04/16/2019   IR US  GUIDE VASC ACCESS RIGHT  05/07/2018   ORCHIECTOMY Right 01/04/2018   Procedure: RIGHT RADICAL ORCHIECTOMY;  Surgeon: Watt Rush, MD;   Location: [facility];  Service: Urology;  Laterality: Right;    Social History: Social History   Socioeconomic History   Marital status: Single    Spouse name: Not on file   Number of children: Not on file   Years of education: Not on file   Highest education level: Not on file  Occupational History   Not on file  Tobacco Use   Smoking status: Never   Smokeless tobacco: Never  Vaping Use   Vaping status: Never Used  Substance and Sexual Activity   Alcohol use: No   Drug use: No   Sexual activity: Not on file  Other Topics Concern   Not on file  Social History Narrative   Not on file   Social Drivers of Health   Financial Resource Strain: Not on file  Food Insecurity: Not on file  Transportation Needs: Not on file  Physical Activity: Not on file  Stress: Not on file  Social Connections: Not on file  Intimate  Partner Violence: Not on file    Family History: No family history on file.  Current Medications:  Current Outpatient Medications:    ALPRAZolam (XANAX) 0.5 MG tablet, Take 0.5 mg by mouth 3 (three) times daily as needed., Disp: , Rfl:    busPIRone (BUSPAR) 10 MG tablet, Take 10 mg by mouth 2 (two) times daily., Disp: , Rfl:    citalopram (CELEXA) 40 MG tablet, Take 40 mg by mouth daily., Disp: , Rfl:    gabapentin (NEURONTIN) 300 MG capsule, Take 300 mg by mouth at bedtime. Take one at bedtime, Disp: , Rfl:    ibuprofen (ADVIL,MOTRIN) 200 MG tablet, Take 200 mg by mouth every 6 (six) hours as needed for moderate pain. , Disp: , Rfl:    Allergies: No Known Allergies  REVIEW OF SYSTEMS:   Review of Systems  Constitutional:  Negative for chills, fatigue and fever.  HENT:   Negative for lump/mass, mouth sores, nosebleeds, sore throat and trouble swallowing.   Eyes:  Negative for eye problems.  Respiratory:  Positive for cough. Negative for shortness of breath.   Cardiovascular:  Negative for chest pain, leg swelling and palpitations.   Gastrointestinal:  Negative for abdominal pain, constipation, diarrhea, nausea and vomiting.  Genitourinary:  Negative for bladder incontinence, difficulty urinating, dysuria, frequency, hematuria and nocturia.   Musculoskeletal:  Negative for arthralgias, back pain, flank pain, myalgias and neck pain.  Skin:  Negative for itching and rash.  Neurological:  Negative for dizziness, headaches and numbness.  Hematological:  Does not bruise/bleed easily.  Psychiatric/Behavioral:  Negative for depression, sleep disturbance and suicidal ideas. The patient is not nervous/anxious.   All other systems reviewed and are negative.    VITALS:   Blood pressure 116/85, pulse 93, temperature 99.2 F (37.3 C), resp. rate 18, weight 225 lb 1.4 oz (102.1 kg), SpO2 97%.  Wt Readings from Last 3 Encounters:  12/10/23 225 lb 1.4 oz (102.1 kg)  06/18/23 219 lb 9.6 oz (99.6 kg)  12/13/22 222 lb 6.4 oz (100.9 kg)    Body mass index is 31.39 kg/m.  Performance status (ECOG): 1 - Symptomatic but completely ambulatory  PHYSICAL EXAM:   Physical Exam Vitals and nursing note reviewed. Exam conducted with a chaperone present.  Constitutional:      Appearance: Normal appearance.  Cardiovascular:     Rate and Rhythm: Normal rate and regular rhythm.     Pulses: Normal pulses.     Heart sounds: Normal heart sounds.  Pulmonary:     Effort: Pulmonary effort is normal.     Breath sounds: Normal breath sounds.  Abdominal:     Palpations: Abdomen is soft. There is no hepatomegaly, splenomegaly or mass.     Tenderness: There is no abdominal tenderness.  Musculoskeletal:     Right lower leg: No edema.     Left lower leg: No edema.  Lymphadenopathy:     Cervical: No cervical adenopathy.     Right cervical: No superficial, deep or posterior cervical adenopathy.    Left cervical: No superficial, deep or posterior cervical adenopathy.     Upper Body:     Right upper body: No supraclavicular or axillary  adenopathy.     Left upper body: No supraclavicular or axillary adenopathy.  Neurological:     General: No focal deficit present.     Mental Status: He is alert and oriented to person, place, and time.  Psychiatric:        Mood and Affect: Mood  normal.        Behavior: Behavior normal.     LABS:   CBC     Component Value Date/Time   WBC 6.9 11/30/2023 1332   RBC 5.77 11/30/2023 1332   HGB 17.2 (H) 11/30/2023 1332   HGB 16.9 12/13/2022 1304   HCT 50.4 11/30/2023 1332   PLT 285 11/30/2023 1332   PLT 347 12/13/2022 1304   MCV 87.3 11/30/2023 1332   MCH 29.8 11/30/2023 1332   MCHC 34.1 11/30/2023 1332   RDW 12.3 11/30/2023 1332   LYMPHSABS 2.6 11/30/2023 1332   MONOABS 0.4 11/30/2023 1332   EOSABS 0.3 11/30/2023 1332   BASOSABS 0.1 11/30/2023 1332    CMP      Component Value Date/Time   NA 140 11/30/2023 1332   K 4.4 11/30/2023 1332   CL 106 11/30/2023 1332   CO2 27 11/30/2023 1332   GLUCOSE 109 (H) 11/30/2023 1332   BUN 17 11/30/2023 1332   CREATININE 1.34 (H) 11/30/2023 1332   CREATININE 1.32 (H) 12/13/2022 1304   CALCIUM 9.8 11/30/2023 1332   PROT 7.7 11/30/2023 1332   ALBUMIN 4.4 11/30/2023 1332   AST 34 11/30/2023 1332   AST 23 12/13/2022 1304   ALT 59 (H) 11/30/2023 1332   ALT 43 12/13/2022 1304   ALKPHOS 72 11/30/2023 1332   BILITOT 1.8 (H) 11/30/2023 1332   BILITOT 1.5 (H) 12/13/2022 1304   GFRNONAA >60 11/30/2023 1332   GFRNONAA >60 12/13/2022 1304   GFRAA >60 04/01/2019 1106     No results found for: CEA1, CEA / No results found for: CEA1, CEA No results found for: PSA1 No results found for: CAN199 No results found for: CAN125  No results found for: TOTALPROTELP, ALBUMINELP, A1GS, A2GS, BETS, BETA2SER, GAMS, MSPIKE, SPEI No results found for: TIBC, FERRITIN, IRONPCTSAT Lab Results  Component Value Date   LDH 171 11/30/2023   LDH 168 06/18/2023   LDH 166 12/13/2022     STUDIES:   CT CHEST ABDOMEN  PELVIS W CONTRAST Result Date: 12/03/2023 CLINICAL DATA:  Testicular cancer, monitor.  * Tracking Code: BO *. EXAM: CT CHEST, ABDOMEN, AND PELVIS WITH CONTRAST TECHNIQUE: Multidetector CT imaging of the chest, abdomen and pelvis was performed following the standard protocol during bolus administration of intravenous contrast. RADIATION DOSE REDUCTION: This exam was performed according to the departmental dose-optimization program which includes automated exposure control, adjustment of the mA and/or kV according to patient size and/or use of iterative reconstruction technique. CONTRAST:  90mL ISOVUE -300 IOPAMIDOL  (ISOVUE -300) INJECTION 61% COMPARISON:  Multiple priors including CT December 01, 2022 and MRI January 01, 2023. FINDINGS: CT CHEST FINDINGS Cardiovascular: No significant vascular findings. Normal heart size. No pericardial effusion. Mediastinum/Nodes: Similar feathery soft tissue stranding in the anterior mediastinum which conforms to underlying vasculature compatible with thymic tissue. No pathologically enlarged mediastinal, hilar or axillary lymph nodes. No suspicious thyroid nodule. The esophagus is grossly unremarkable. Lungs/Pleura: No suspicious pulmonary nodules or masses. Scattered atelectasis/scarring. No pleural effusion. No pneumothorax. Musculoskeletal: Hyperdense foci in the C7 and T1 vertebral bodies appears less intense than on prior imaging for instance on image 28/8 and previously evaluated by spine MRI, without discrete lesion seen on that examination and favored to reflect venous enhancement. No new aggressive lytic or blastic lesion of bone. CT ABDOMEN PELVIS FINDINGS Hepatobiliary: No suspicious hepatic lesion. Gallbladder is unremarkable. No biliary ductal dilation. Pancreas: No pancreatic ductal dilation or evidence of acute inflammation. Spleen: No splenomegaly. Adrenals/Urinary Tract: Bilateral adrenal glands  appear normal. No hydronephrosis. Kidneys demonstrate symmetric  enhancement. Urinary bladder is unremarkable for degree of distension. Stomach/Bowel: Stomach is within normal limits. Appendix appears normal. No evidence of bowel wall thickening, distention, or inflammatory changes. Vascular/Lymphatic: Normal caliber abdominal aorta. Smooth IVC contours. No pathologically enlarged abdominal or pelvic lymph nodes. Prominent retroperitoneal lymph nodes are similar prior examination for instance the left periaortic lymph node measuring 6 mm in short axis on image 78/2 is unchanged. Reproductive: Prostate gland is unremarkable. Prior right orchiectomy. Other: No significant abdominopelvic free fluid. Musculoskeletal: Stable bone islands in the anterior L3 vertebral body, right hemi sacrum and left acetabulum. No new suspicious osseous lesion. IMPRESSION: 1. Stable examination without evidence of recurrent or metastatic disease in the chest, abdomen or pelvis. Electronically Signed   By: Reyes Holder M.D.   On: 12/03/2023 09:54

## 2023-12-10 ENCOUNTER — Inpatient Hospital Stay: Payer: No Typology Code available for payment source | Attending: Hematology | Admitting: Hematology

## 2023-12-10 VITALS — BP 116/85 | HR 93 | Temp 99.2°F | Resp 18 | Wt 225.1 lb

## 2023-12-10 DIAGNOSIS — C6290 Malignant neoplasm of unspecified testis, unspecified whether descended or undescended: Secondary | ICD-10-CM | POA: Diagnosis not present

## 2023-12-10 DIAGNOSIS — C6291 Malignant neoplasm of right testis, unspecified whether descended or undescended: Secondary | ICD-10-CM | POA: Insufficient documentation

## 2023-12-10 NOTE — Patient Instructions (Addendum)
 [facility] at [facility] Discharge Instructions   You were seen and examined today by Dr. Rogers.  He reviewed the results of your lab work which were normal. Your cancer tumor markers were all normal.   He reviewed the results of your CT scan which did not show any evidence of cancer.   We will see you back in 1 year. We will repeat lab work prior to this visit.    Thank you for choosing [facility] at [facility] to provide your oncology and hematology care.  To afford each patient quality time with our provider, please arrive at least 15 minutes before your scheduled appointment time.   If you have a lab appointment with the Cancer Center please come in thru the Main Entrance and check in at the main information desk.  You need to re-schedule your appointment should you arrive 10 or more minutes late.  We strive to give you quality time with our providers, and arriving late affects you and other patients whose appointments are after yours.  Also, if you no show three or more times for appointments you may be dismissed from the clinic at the providers discretion.     Again, thank you for choosing [facility].  Our hope is that these requests will decrease the amount of time that you wait before being seen by our physicians.       _____________________________________________________________  Should you have questions after your visit to [facility], please contact our office at [phone] and follow the prompts.  Our office hours are 8:00 a.m. and 4:30 p.m. Monday - Friday.  Please note that voicemails left after 4:00 p.m. may not be returned until the following business day.  We are closed weekends and major holidays.  You do have access to a nurse 24-7, just call the main number to the clinic [phone] and do not press any options, hold on the line and a nurse will answer the phone.    For prescription  refill requests, have your pharmacy contact our office and allow 72 hours.    Due to Covid, you will need to wear a mask upon entering the hospital. If you do not have a mask, a mask will be given to you at the Main Entrance upon arrival. For doctor visits, patients may have 1 support person age 29 or older with them. For treatment visits, patients can not have anyone with them due to social distancing guidelines and our immunocompromised population.

## 2024-12-08 ENCOUNTER — Inpatient Hospital Stay: Payer: No Typology Code available for payment source | Attending: Physician Assistant

## 2024-12-08 DIAGNOSIS — C6290 Malignant neoplasm of unspecified testis, unspecified whether descended or undescended: Secondary | ICD-10-CM

## 2024-12-08 LAB — COMPREHENSIVE METABOLIC PANEL WITH GFR
ALT: 60 U/L — ABNORMAL HIGH (ref 0–44)
AST: 33 U/L (ref 15–41)
Albumin: 4.7 g/dL (ref 3.5–5.0)
Alkaline Phosphatase: 79 U/L (ref 38–126)
Anion gap: 13 (ref 5–15)
BUN: 19 mg/dL (ref 6–20)
CO2: 24 mmol/L (ref 22–32)
Calcium: 9.6 mg/dL (ref 8.9–10.3)
Chloride: 105 mmol/L (ref 98–111)
Creatinine, Ser: 1.24 mg/dL (ref 0.61–1.24)
GFR, Estimated: >60 mL/min
Glucose, Bld: 68 mg/dL — ABNORMAL LOW (ref 70–99)
Potassium: 4 mmol/L (ref 3.5–5.1)
Sodium: 141 mmol/L (ref 135–145)
Total Bilirubin: 1.3 mg/dL — ABNORMAL HIGH (ref 0.0–1.2)
Total Protein: 7.5 g/dL (ref 6.5–8.1)

## 2024-12-08 LAB — CBC WITH DIFFERENTIAL/PLATELET
Abs Immature Granulocytes: 0.03 K/uL (ref 0.00–0.07)
Basophils Absolute: 0.1 K/uL (ref 0.0–0.1)
Basophils Relative: 1 %
Eosinophils Absolute: 0.3 K/uL (ref 0.0–0.5)
Eosinophils Relative: 3 %
HCT: 49.7 % (ref 39.0–52.0)
Hemoglobin: 16.7 g/dL (ref 13.0–17.0)
Immature Granulocytes: 0 %
Lymphocytes Relative: 11 %
Lymphs Abs: 1 K/uL (ref 0.7–4.0)
MCH: 29.2 pg (ref 26.0–34.0)
MCHC: 33.6 g/dL (ref 30.0–36.0)
MCV: 87 fL (ref 80.0–100.0)
Monocytes Absolute: 0.8 K/uL (ref 0.1–1.0)
Monocytes Relative: 8 %
Neutro Abs: 7.2 K/uL (ref 1.7–7.7)
Neutrophils Relative %: 77 %
Platelets: 322 K/uL (ref 150–400)
RBC: 5.71 MIL/uL (ref 4.22–5.81)
RDW: 12.3 % (ref 11.5–15.5)
WBC: 9.3 K/uL (ref 4.0–10.5)
nRBC: 0 % (ref 0.0–0.2)

## 2024-12-08 LAB — LACTATE DEHYDROGENASE: LDH: 222 U/L (ref 105–235)

## 2024-12-09 LAB — AFP TUMOR MARKER: AFP, Serum, Tumor Marker: 2.3 ng/mL (ref 0.0–5.7)

## 2024-12-09 LAB — BETA HCG QUANT (REF LAB): hCG Quant: <1 m[IU]/mL (ref 0–3)

## 2024-12-13 NOTE — Progress Notes (Unsigned)
 "  VIRTUAL VISIT via TELEPHONE NOTE [facility]   I connected with Greg Peterson  on 12/15/2024 at 2:05 PM by telephone and verified that I am speaking with the correct person using two identifiers.  Location: Patient: Home Provider: [facility]   I discussed the limitations, risks, security and privacy concerns of performing an evaluation and management service by telephone and the availability of in person appointments. I also discussed with the patient that there may be a patient responsible charge related to this service. The patient expressed understanding and agreed to proceed.   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  PRIOR THERAPY: - Right orchiectomy (01/04/2018) - 3 cycles of BEP from 05/20/2018 through 07/01/2018  CURRENT THERAPY: Surveillance  BRIEF ONCOLOGIC HISTORY:   Oncology History  Testis cancer (HCC)  05/01/2018 Initial Diagnosis   Testis cancer (HCC)   05/20/2018 - 07/05/2018 Chemotherapy   The patient had palonosetron  (ALOXI ) injection 0.25 mg, 0.25 mg, Intravenous,  Once, 3 of 3 cycles Administration: 0.25 mg (05/20/2018), 0.25 mg (05/22/2018), 0.25 mg (05/24/2018), 0.25 mg (06/10/2018), 0.25 mg (06/12/2018), 0.25 mg (06/14/2018), 0.25 mg (07/01/2018), 0.25 mg (07/03/2018), 0.25 mg (07/05/2018) bleomycin  (BLEOCIN) 30 Units in sodium chloride  0.9 % 50 mL chemo infusion, 30 Units, Intravenous,  Once, 2 of 2 cycles Administration: 30 Units (05/21/2018), 30 Units (05/28/2018), 30 Units (06/04/2018), 30 Units (06/18/2018) etoposide  (VEPESID ) 200 mg in sodium chloride  0.9 % 500 mL chemo infusion, 200 mg, Intravenous,  Once, 1 of 1 cycle Administration: 200 mg (05/24/2018) CISplatin  (PLATINOL ) 41 mg in sodium chloride  0.9 % 250 mL chemo infusion, 20 mg/m2 = 41 mg, Intravenous,  Once, 3 of 3 cycles Administration: 41 mg (05/20/2018), 41 mg (05/21/2018), 41 mg (05/22/2018), 41 mg (05/23/2018), 41 mg  (05/24/2018), 41 mg (06/11/2018), 41 mg (06/12/2018), 41 mg (06/13/2018), 41 mg (06/14/2018), 41 mg (07/01/2018), 41 mg (07/02/2018), 41 mg (07/03/2018), 41 mg (07/04/2018), 41 mg (07/05/2018) etoposide  (VEPESID ) 200 mg in sodium chloride  0.9 % 500 mL chemo infusion, 98 mg/m2 = 210 mg, Intravenous,  Once, 3 of 3 cycles Administration: 200 mg (05/20/2018), 200 mg (05/21/2018), 200 mg (05/22/2018), 200 mg (05/23/2018), 200 mg (06/10/2018), 200 mg (06/11/2018), 200 mg (06/12/2018), 200 mg (06/13/2018), 200 mg (06/14/2018), 200 mg (07/01/2018), 200 mg (07/02/2018), 200 mg (07/03/2018), 200 mg (07/04/2018), 200 mg (07/05/2018) fosaprepitant  (EMEND ) 150 mg, dexamethasone  (DECADRON ) 12 mg in sodium chloride  0.9 % 145 mL IVPB, , Intravenous,  Once, 3 of 3 cycles Administration:  (05/20/2018),  (05/22/2018),  (05/24/2018),  (06/10/2018),  (06/12/2018),  (06/14/2018),  (07/01/2018),  (07/03/2018),  (07/05/2018)  for chemotherapy treatment.      CANCER STAGING: Cancer Staging  No matching staging information was found for the patient.   INTERVAL HISTORY:   Mr. Greg Peterson, a 29 y.o. 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  [name] was last seen on 12/10/2023 by Dr. Rogers.   In the interim since last visit, he has not had any surgeries, hospitalizations, or changes in baseline health status.   At today's visit, he reports feeling somewhat poorly due to current influenza infection. He feels fatigued with poor appetite due to the flu, but prior to that he reports that he had good energy and good appetite. He  is maintaining stable weight at this time.  He has some cough, nausea, and diarrhea related to influenza.   Otherwise, he  denies any fever, chills, fatigue, unintentional weight loss, cough, dyspnea, chest pain, headaches, focal neurologic deficits, abdominal pain, or new onset bone pain. He denies any testicular masses or lumps.  No  lymphadenopathy.  ASSESSMENT & PLAN:  1.  Stage IIc right testicular seminoma with retroperitoneal adenopathy - Initially treated by Dr. Amadeo at [facility].  Survivorship care transferred to [facility] after previous medical oncologist left. - Right orchiectomy (01/04/2018): 5.2 cm seminoma confined within tunica albuginea, margins negative, focal lymphovascular involvement by tumor.  PT2 pNX. - 04/17/2018: CT AP new abdominal retroperitoneal lymphadenopathy in the aortocaval space, largest lymph node measuring 3.2 cm. - Elevated hCG 121, LDH 367 in May 2019. - 3 cycles of BEP from 05/20/2018 through 07/01/2018 - CT CAP from 12/03/2023: No evidence of recurrence or metastatic disease. - Testicular exam deferred.  Patient denies any left testicular lumps. - He does not have any clinical signs/symptoms of recurrence. - Reviewed labs from 12/08/2024:  Mildly elevated ALT (intermittently elevated since July 2019) is stable.  CT CAP from 12/03/2023 negative for suspicious hepatic lesion.  Mildly elevated total bilirubin since 2019 is also stable.  Consider possible chronic LFT abnormalities due to hepatotoxic effects of prior chemotherapy. CBC normal. Normal LDH, AFP, hCG. - PLAN: He has completed 5 years of surveillance, no further imaging unless clinical condition dictates.  Continue annual oncology follow-up with physical exam and tumor markers. NCCN GUIDELINES for SURVEILLANCE OF TESTICULAR CANCER AFTER COMPLETION OF INITIAL THERAPY:  Stage IIc Seminoma, s/p treatment with chemotherapy without RPLND:  H&P and tumor markers every 2 months in year 1, every 3 months in year 2, and every 6 months in years 3-4, then annually and year 5 Close attention to cervical and supraclavicular nodal regions, abdominal examination (presence of masses or hepatomegaly) and presence of neurologic sign/symptoms Testicular examination should be performed at each visit because of higher incidence of  contralateral testes cancer in patients with unilateral testes cancer Testicular ultrasound to be obtained for an equivocal examination CT abdomen/pelvis with contrast every 4 months in year 1, every 6 months in year 2, annually in year 3 and 4, if clinically indicated a year 5.  (Contrast-enhanced MRI of abdomen and pelvis is reasonable alternative to CT) CXR every 4 months in year 1, every 6 months in year 2, annually in year 3-5  ADDITIONAL INFORMATION FROM UP-TO-DATE: ** Most patients with testicular cancer who will relapse do so within the first 2 years after initial treatment.  Late relapses (after 5 years) can occur, but are rare. ** Recurrent or progressive disease should be considered if rising tumor markers and/or presence of radiographically documented progressive disease    2.  Sclerotic lesion at C7 and T1 vertebral body - Per Dr. Amadeo note on 12/13/2022: Etiology is unclear at this time. Metastatic testicular cancer is extremely unlikely at this time. Benign etiology versus different malignancy needs to be evaluated at this time. - MRI cervical/thoracic spine (01/01/2023): No suspicious marrow signal abnormality in the cervical or thoracic spine to correspond to finding on prior CT.  Finding on CT may have reflected artifact/pseudolesions related to venous enhancement. - CT CAP 12/03/2023: Musculoskeletal: Hyperdense foci in the C7 and T1 vertebral bodies appears less intense than on prior imaging and previously evaluated by spine MRI, without discrete lesion seen on that examination and favored to reflect venous enhancement. No new aggressive lytic or blastic lesion of bone.  3.  Social/family history: - He lives at home with  his parents.  He has autism spectrum disorder. - He works at [employer] in [place].  Non-smoker.  No chemical exposure. - Paternal grandfather and maternal grandfather had prostate cancer.  PLAN SUMMARY: >> Labs in 1 year = CBC/D, CMP, LDH, AFP, hCG >>  OFFICE visit in 1 year (1 week after labs)  ** Last office visit 12/10/2023     REVIEW OF SYSTEMS:   Review of Systems  Constitutional:  Positive for malaise/fatigue. Negative for chills, diaphoresis, fever and weight loss.  Respiratory:  Positive for cough. Negative for shortness of breath.   Cardiovascular:  Negative for chest pain and palpitations.  Gastrointestinal:  Positive for constipation and diarrhea. Negative for abdominal pain, blood in stool, melena, nausea and vomiting.  Neurological:  Negative for dizziness and headaches.  Psychiatric/Behavioral:  The patient has insomnia.      PHYSICAL EXAM: (per limitations of virtual telephone visit)  The patient is alert and oriented x 3, exhibiting adequate mentation, good mood, and ability to speak in full sentences and execute sound judgement.  WRAP UP:   I discussed the assessment and treatment plan with the patient. The patient was provided an opportunity to ask questions and all were answered. The patient agreed with the plan and demonstrated an understanding of the instructions.   The patient was advised to call back or seek an in-person evaluation if the symptoms worsen or if the condition fails to improve as anticipated.  I provided 22 minutes of non-face-to-face time during this encounter, including > 10 minutes of medical discussion.  Pleasant CHRISTELLA Barefoot, PA-C 12/15/2024 2:27 PM    "

## 2024-12-15 ENCOUNTER — Inpatient Hospital Stay: Payer: No Typology Code available for payment source | Admitting: Physician Assistant

## 2024-12-15 DIAGNOSIS — C6290 Malignant neoplasm of unspecified testis, unspecified whether descended or undescended: Secondary | ICD-10-CM

## 2024-12-15 DIAGNOSIS — C6291 Malignant neoplasm of right testis, unspecified whether descended or undescended: Secondary | ICD-10-CM

## 2025-12-14 ENCOUNTER — Inpatient Hospital Stay

## 2025-12-21 ENCOUNTER — Inpatient Hospital Stay: Admitting: Physician Assistant
